# Patient Record
Sex: Male | Born: 1935 | Race: White | Hispanic: No | Marital: Married | Smoking: Light tobacco smoker
Health system: Southern US, Community
[De-identification: ages and names within clinical notes are randomized; demographics above are authoritative.]

## PROBLEM LIST (undated history)

## (undated) DIAGNOSIS — E039 Hypothyroidism, unspecified: Secondary | ICD-10-CM

## (undated) DIAGNOSIS — E785 Hyperlipidemia, unspecified: Secondary | ICD-10-CM

## (undated) DIAGNOSIS — I447 Left bundle-branch block, unspecified: Secondary | ICD-10-CM

## (undated) DIAGNOSIS — E119 Type 2 diabetes mellitus without complications: Secondary | ICD-10-CM

## (undated) DIAGNOSIS — R0789 Other chest pain: Secondary | ICD-10-CM

## (undated) HISTORY — PX: CHOLECYSTECTOMY: SHX55

## (undated) HISTORY — DX: Type 2 diabetes mellitus without complications: E11.9

## (undated) HISTORY — PX: HERNIA REPAIR: SHX51

## (undated) HISTORY — DX: Left bundle-branch block, unspecified: I44.7

## (undated) HISTORY — DX: Hyperlipidemia, unspecified: E78.5

---

## 2000-09-21 HISTORY — PX: CARDIAC CATHETERIZATION: SHX172

## 2000-10-05 ENCOUNTER — Inpatient Hospital Stay (HOSPITAL_COMMUNITY): Admission: EM | Admit: 2000-10-05 | Discharge: 2000-10-06 | Payer: Self-pay | Admitting: *Deleted

## 2000-10-05 ENCOUNTER — Encounter: Payer: Self-pay | Admitting: *Deleted

## 2004-08-21 ENCOUNTER — Ambulatory Visit (HOSPITAL_COMMUNITY): Admission: RE | Admit: 2004-08-21 | Discharge: 2004-08-21 | Payer: Self-pay | Admitting: Gastroenterology

## 2011-01-14 ENCOUNTER — Other Ambulatory Visit: Payer: Self-pay | Admitting: Dermatology

## 2011-07-15 ENCOUNTER — Other Ambulatory Visit: Payer: Self-pay | Admitting: Dermatology

## 2012-01-06 DIAGNOSIS — E119 Type 2 diabetes mellitus without complications: Secondary | ICD-10-CM | POA: Diagnosis not present

## 2012-01-06 DIAGNOSIS — E782 Mixed hyperlipidemia: Secondary | ICD-10-CM | POA: Diagnosis not present

## 2012-01-15 ENCOUNTER — Other Ambulatory Visit: Payer: Self-pay | Admitting: Dermatology

## 2012-01-15 DIAGNOSIS — D485 Neoplasm of uncertain behavior of skin: Secondary | ICD-10-CM | POA: Diagnosis not present

## 2012-01-15 DIAGNOSIS — L821 Other seborrheic keratosis: Secondary | ICD-10-CM | POA: Diagnosis not present

## 2012-01-15 DIAGNOSIS — Z85828 Personal history of other malignant neoplasm of skin: Secondary | ICD-10-CM | POA: Diagnosis not present

## 2012-01-15 DIAGNOSIS — C4441 Basal cell carcinoma of skin of scalp and neck: Secondary | ICD-10-CM | POA: Diagnosis not present

## 2012-01-15 DIAGNOSIS — C4442 Squamous cell carcinoma of skin of scalp and neck: Secondary | ICD-10-CM | POA: Diagnosis not present

## 2012-01-15 DIAGNOSIS — C44319 Basal cell carcinoma of skin of other parts of face: Secondary | ICD-10-CM | POA: Diagnosis not present

## 2012-02-03 DIAGNOSIS — C4492 Squamous cell carcinoma of skin, unspecified: Secondary | ICD-10-CM | POA: Diagnosis not present

## 2012-03-10 DIAGNOSIS — E039 Hypothyroidism, unspecified: Secondary | ICD-10-CM | POA: Diagnosis not present

## 2012-03-10 DIAGNOSIS — E1149 Type 2 diabetes mellitus with other diabetic neurological complication: Secondary | ICD-10-CM | POA: Diagnosis not present

## 2012-03-10 DIAGNOSIS — E7800 Pure hypercholesterolemia, unspecified: Secondary | ICD-10-CM | POA: Diagnosis not present

## 2012-03-10 DIAGNOSIS — E559 Vitamin D deficiency, unspecified: Secondary | ICD-10-CM | POA: Diagnosis not present

## 2012-03-10 DIAGNOSIS — Z79899 Other long term (current) drug therapy: Secondary | ICD-10-CM | POA: Diagnosis not present

## 2012-03-16 DIAGNOSIS — K219 Gastro-esophageal reflux disease without esophagitis: Secondary | ICD-10-CM | POA: Diagnosis not present

## 2012-03-16 DIAGNOSIS — E039 Hypothyroidism, unspecified: Secondary | ICD-10-CM | POA: Diagnosis not present

## 2012-03-16 DIAGNOSIS — E7800 Pure hypercholesterolemia, unspecified: Secondary | ICD-10-CM | POA: Diagnosis not present

## 2012-03-16 DIAGNOSIS — E1149 Type 2 diabetes mellitus with other diabetic neurological complication: Secondary | ICD-10-CM | POA: Diagnosis not present

## 2012-06-02 DIAGNOSIS — E119 Type 2 diabetes mellitus without complications: Secondary | ICD-10-CM | POA: Diagnosis not present

## 2012-07-14 ENCOUNTER — Other Ambulatory Visit: Payer: Self-pay | Admitting: Dermatology

## 2012-07-14 DIAGNOSIS — Z85828 Personal history of other malignant neoplasm of skin: Secondary | ICD-10-CM | POA: Diagnosis not present

## 2012-07-14 DIAGNOSIS — C44519 Basal cell carcinoma of skin of other part of trunk: Secondary | ICD-10-CM | POA: Diagnosis not present

## 2012-07-14 DIAGNOSIS — L570 Actinic keratosis: Secondary | ICD-10-CM | POA: Diagnosis not present

## 2012-07-14 DIAGNOSIS — C44611 Basal cell carcinoma of skin of unspecified upper limb, including shoulder: Secondary | ICD-10-CM | POA: Diagnosis not present

## 2012-07-14 DIAGNOSIS — C44211 Basal cell carcinoma of skin of unspecified ear and external auricular canal: Secondary | ICD-10-CM | POA: Diagnosis not present

## 2012-07-14 DIAGNOSIS — D239 Other benign neoplasm of skin, unspecified: Secondary | ICD-10-CM | POA: Diagnosis not present

## 2012-07-15 DIAGNOSIS — E1149 Type 2 diabetes mellitus with other diabetic neurological complication: Secondary | ICD-10-CM | POA: Diagnosis not present

## 2012-07-15 DIAGNOSIS — E039 Hypothyroidism, unspecified: Secondary | ICD-10-CM | POA: Diagnosis not present

## 2012-07-15 DIAGNOSIS — E7800 Pure hypercholesterolemia, unspecified: Secondary | ICD-10-CM | POA: Diagnosis not present

## 2012-07-16 DIAGNOSIS — E7800 Pure hypercholesterolemia, unspecified: Secondary | ICD-10-CM | POA: Diagnosis not present

## 2012-07-16 DIAGNOSIS — K219 Gastro-esophageal reflux disease without esophagitis: Secondary | ICD-10-CM | POA: Diagnosis not present

## 2012-07-16 DIAGNOSIS — E039 Hypothyroidism, unspecified: Secondary | ICD-10-CM | POA: Diagnosis not present

## 2012-07-16 DIAGNOSIS — E1149 Type 2 diabetes mellitus with other diabetic neurological complication: Secondary | ICD-10-CM | POA: Diagnosis not present

## 2012-09-21 DIAGNOSIS — J019 Acute sinusitis, unspecified: Secondary | ICD-10-CM | POA: Diagnosis not present

## 2012-10-29 DIAGNOSIS — E1149 Type 2 diabetes mellitus with other diabetic neurological complication: Secondary | ICD-10-CM | POA: Diagnosis not present

## 2012-10-29 DIAGNOSIS — E039 Hypothyroidism, unspecified: Secondary | ICD-10-CM | POA: Diagnosis not present

## 2012-10-29 DIAGNOSIS — Z125 Encounter for screening for malignant neoplasm of prostate: Secondary | ICD-10-CM | POA: Diagnosis not present

## 2012-10-29 DIAGNOSIS — E7800 Pure hypercholesterolemia, unspecified: Secondary | ICD-10-CM | POA: Diagnosis not present

## 2012-11-05 DIAGNOSIS — Z23 Encounter for immunization: Secondary | ICD-10-CM | POA: Diagnosis not present

## 2012-11-05 DIAGNOSIS — E1149 Type 2 diabetes mellitus with other diabetic neurological complication: Secondary | ICD-10-CM | POA: Diagnosis not present

## 2012-11-05 DIAGNOSIS — K219 Gastro-esophageal reflux disease without esophagitis: Secondary | ICD-10-CM | POA: Diagnosis not present

## 2012-11-05 DIAGNOSIS — E7800 Pure hypercholesterolemia, unspecified: Secondary | ICD-10-CM | POA: Diagnosis not present

## 2012-11-05 DIAGNOSIS — E039 Hypothyroidism, unspecified: Secondary | ICD-10-CM | POA: Diagnosis not present

## 2013-01-13 ENCOUNTER — Other Ambulatory Visit: Payer: Self-pay | Admitting: Dermatology

## 2013-01-13 DIAGNOSIS — D485 Neoplasm of uncertain behavior of skin: Secondary | ICD-10-CM | POA: Diagnosis not present

## 2013-01-13 DIAGNOSIS — C44211 Basal cell carcinoma of skin of unspecified ear and external auricular canal: Secondary | ICD-10-CM | POA: Diagnosis not present

## 2013-01-13 DIAGNOSIS — L570 Actinic keratosis: Secondary | ICD-10-CM | POA: Diagnosis not present

## 2013-01-13 DIAGNOSIS — Z85828 Personal history of other malignant neoplasm of skin: Secondary | ICD-10-CM | POA: Diagnosis not present

## 2013-01-13 DIAGNOSIS — L821 Other seborrheic keratosis: Secondary | ICD-10-CM | POA: Diagnosis not present

## 2013-01-17 ENCOUNTER — Other Ambulatory Visit (HOSPITAL_COMMUNITY): Payer: Self-pay | Admitting: Cardiovascular Disease

## 2013-01-17 DIAGNOSIS — I10 Essential (primary) hypertension: Secondary | ICD-10-CM | POA: Diagnosis not present

## 2013-01-17 DIAGNOSIS — R0989 Other specified symptoms and signs involving the circulatory and respiratory systems: Secondary | ICD-10-CM

## 2013-01-17 DIAGNOSIS — E119 Type 2 diabetes mellitus without complications: Secondary | ICD-10-CM | POA: Diagnosis not present

## 2013-01-17 DIAGNOSIS — E782 Mixed hyperlipidemia: Secondary | ICD-10-CM | POA: Diagnosis not present

## 2013-01-19 ENCOUNTER — Encounter (HOSPITAL_COMMUNITY): Payer: Self-pay

## 2013-01-26 ENCOUNTER — Ambulatory Visit (HOSPITAL_COMMUNITY)
Admission: RE | Admit: 2013-01-26 | Discharge: 2013-01-26 | Disposition: A | Discharge status: Home / Self Care | Payer: Medicare Other | Source: Ambulatory Visit | Attending: Cardiovascular Disease | Admitting: Cardiovascular Disease

## 2013-01-26 DIAGNOSIS — I2510 Atherosclerotic heart disease of native coronary artery without angina pectoris: Secondary | ICD-10-CM | POA: Insufficient documentation

## 2013-01-26 DIAGNOSIS — E109 Type 1 diabetes mellitus without complications: Secondary | ICD-10-CM | POA: Diagnosis not present

## 2013-01-26 DIAGNOSIS — E119 Type 2 diabetes mellitus without complications: Secondary | ICD-10-CM | POA: Insufficient documentation

## 2013-01-26 DIAGNOSIS — R0989 Other specified symptoms and signs involving the circulatory and respiratory systems: Secondary | ICD-10-CM | POA: Insufficient documentation

## 2013-01-26 DIAGNOSIS — I10 Essential (primary) hypertension: Secondary | ICD-10-CM | POA: Diagnosis not present

## 2013-01-26 HISTORY — PX: OTHER SURGICAL HISTORY: SHX169

## 2013-01-26 MED ORDER — REGADENOSON 0.4 MG/5ML IV SOLN
0.4000 mg | Freq: Once | INTRAVENOUS | Status: AC
  Administered 2013-01-26: 0.4 mg via INTRAVENOUS

## 2013-01-26 MED ORDER — TECHNETIUM TC 99M SESTAMIBI GENERIC - CARDIOLITE
30.0000 | Freq: Once | INTRAVENOUS | Status: AC | PRN
  Administered 2013-01-26: 30 via INTRAVENOUS

## 2013-01-26 MED ORDER — TECHNETIUM TC 99M SESTAMIBI GENERIC - CARDIOLITE
10.0000 | Freq: Once | INTRAVENOUS | Status: AC | PRN
  Administered 2013-01-26: 10 via INTRAVENOUS

## 2013-01-26 NOTE — Procedures (Addendum)
[facility] [address] [place] [address] [place] [address] [phone]  Cardiology Nuclear Med Tristan Maxwell is a 79 y.o. male     MRN : [id number]     DOB: 11-09-1934  Procedure Date: 01/26/2013  Nuclear Med Background Indication for Stress Test:  Evaluation for Ischemia History:  non critical CAD with normal LV function Cardiac Risk Factors: History of Smoking, Hypertension, Lipids and NIDDM  Symptoms:  none   Nuclear Pre-Procedure Caffeine/Decaff Intake:  1:00am NPO After: 11:00am   IV Site: R Antecubital  IV 0.9% NS with Angio Cath:  22g  Chest Size (in):  42 IV Started by: Koren Mahlum, CNMT  Height: 5\' 11"  (1.803 m)  Cup Size: n/a  BMI:  Body mass index is 23.29 kg/(m^2). Weight:  167 lb (75.751 kg)   Tech Comments:  n/a    Nuclear Med Study 1 or 2 day study: 1 day  Stress Test Type:  Lexiscan  Order Authorizing Provider:  Nanetta Batty, MD   Resting Radionuclide: Technetium 69m Sestamibi  Resting Radionuclide Dose: 10.2 mCi   Stress Radionuclide:  Technetium 63m Sestamibi  Stress Radionuclide Dose: 30.6 mCi           Stress Protocol Rest HR: 64 Stress HR: 83  Rest BP: 132/77 Stress BP: 144/67  Exercise Time (min): n/a METS: n/a   Predicted Max HR: 144 bpm % Max HR: 57.64 bpm Rate Pressure Product: 21308   Dose of Adenosine (mg):  n/a Dose of Lexiscan: 0.4 mg  Dose of Atropine (mg): n/a Dose of Dobutamine: n/a mcg/kg/min (at max HR)  Stress Test Technologist: Esperanza Sheets, CCT Nuclear Technologist: Koren Siddoway, CNMT   Rest Procedure:  Myocardial perfusion imaging was performed at rest 45 minutes following the intravenous administration of Technetium 99m Sestamibi. Stress Procedure:  The patient received IV Lexiscan 0.4 mg over 15-seconds.  Technetium 11m Sestamibi injected at 30-seconds.  There were no significant changes with Lexiscan.  Quantitative spect images were obtained after a 45 minute  delay.  Transient Ischemic Dilatation (Normal <1.22):  1.17 Lung/Heart Ratio (Normal <0.45):  0.25 QGS EDV:  92 ml QGS ESV:  34 ml LV Ejection Fraction: 63%  Signed by Koren Haney, CNMT  PHYSICIAN INTERPRETATION:  Rest ECG: NSR with non-specific ST-T wave changes  Stress ECG: No significant change from baseline ECG  QPS Raw Data Images:  Mild diaphragmatic attenuation.  Normal left ventricular size. Stress Images:  There is a medium sized, moderate intensity perfusion defect noted in the basal to mid inferior-inferoseptal wall.  There is no evidence of reversibility.  Gated images do not reveal a wall motion abnormality in this region, suggesting the presence of artifact and not infarct. Rest Images:  Comparison with the stress images reveals no significant change. Subtraction (SDS):  No reversibility is appreciated. There is a fixed inferior defect that is most consistent with diaphragmatic attenuation. There is no evidence of scar or ischemia.  Impression Exercise Capacity:  Lexiscan with no exercise. BP Response:  Normal blood pressure response. Clinical Symptoms:  There is dyspnea. ECG Impression:  No significant ST segment change with adenosine. LV Wall Motion:  NL LV Function; NL Wall Motion  Comparison with Prior Nuclear Study: No significant change from previous study  Overall Impression:  Abnormal perfusion imaging with presence of an inferior-inferoseptal perfusion defect consistent with diaphragmatic attenuation.  Low risk stress nuclear study.   Marykay Lex, MD  01/27/2013 2:59 PM

## 2013-01-26 NOTE — Progress Notes (Signed)
Carotid duplex Doppler completed. Tristan Tristan

## 2013-02-07 DIAGNOSIS — J329 Chronic sinusitis, unspecified: Secondary | ICD-10-CM | POA: Diagnosis not present

## 2013-02-07 DIAGNOSIS — J309 Allergic rhinitis, unspecified: Secondary | ICD-10-CM | POA: Diagnosis not present

## 2013-02-24 DIAGNOSIS — E559 Vitamin D deficiency, unspecified: Secondary | ICD-10-CM | POA: Diagnosis not present

## 2013-02-24 DIAGNOSIS — E7800 Pure hypercholesterolemia, unspecified: Secondary | ICD-10-CM | POA: Diagnosis not present

## 2013-02-24 DIAGNOSIS — E1149 Type 2 diabetes mellitus with other diabetic neurological complication: Secondary | ICD-10-CM | POA: Diagnosis not present

## 2013-02-24 DIAGNOSIS — E039 Hypothyroidism, unspecified: Secondary | ICD-10-CM | POA: Diagnosis not present

## 2013-03-04 DIAGNOSIS — E1149 Type 2 diabetes mellitus with other diabetic neurological complication: Secondary | ICD-10-CM | POA: Diagnosis not present

## 2013-03-04 DIAGNOSIS — IMO0002 Reserved for concepts with insufficient information to code with codable children: Secondary | ICD-10-CM | POA: Diagnosis not present

## 2013-03-04 DIAGNOSIS — E039 Hypothyroidism, unspecified: Secondary | ICD-10-CM | POA: Diagnosis not present

## 2013-03-04 DIAGNOSIS — E7800 Pure hypercholesterolemia, unspecified: Secondary | ICD-10-CM | POA: Diagnosis not present

## 2013-04-23 ENCOUNTER — Encounter: Payer: Self-pay | Admitting: Cardiovascular Disease

## 2013-05-23 DIAGNOSIS — R634 Abnormal weight loss: Secondary | ICD-10-CM | POA: Diagnosis not present

## 2013-05-23 DIAGNOSIS — E1149 Type 2 diabetes mellitus with other diabetic neurological complication: Secondary | ICD-10-CM | POA: Diagnosis not present

## 2013-05-27 DIAGNOSIS — R634 Abnormal weight loss: Secondary | ICD-10-CM | POA: Diagnosis not present

## 2013-05-27 DIAGNOSIS — K5730 Diverticulosis of large intestine without perforation or abscess without bleeding: Secondary | ICD-10-CM | POA: Diagnosis not present

## 2013-05-27 DIAGNOSIS — K449 Diaphragmatic hernia without obstruction or gangrene: Secondary | ICD-10-CM | POA: Diagnosis not present

## 2013-06-30 DIAGNOSIS — E119 Type 2 diabetes mellitus without complications: Secondary | ICD-10-CM | POA: Diagnosis not present

## 2013-07-14 DIAGNOSIS — L570 Actinic keratosis: Secondary | ICD-10-CM | POA: Diagnosis not present

## 2013-07-14 DIAGNOSIS — L820 Inflamed seborrheic keratosis: Secondary | ICD-10-CM | POA: Diagnosis not present

## 2013-07-14 DIAGNOSIS — L919 Hypertrophic disorder of the skin, unspecified: Secondary | ICD-10-CM | POA: Diagnosis not present

## 2013-07-14 DIAGNOSIS — Z85828 Personal history of other malignant neoplasm of skin: Secondary | ICD-10-CM | POA: Diagnosis not present

## 2013-07-14 DIAGNOSIS — L821 Other seborrheic keratosis: Secondary | ICD-10-CM | POA: Diagnosis not present

## 2013-07-14 DIAGNOSIS — L723 Sebaceous cyst: Secondary | ICD-10-CM | POA: Diagnosis not present

## 2013-09-02 DIAGNOSIS — E559 Vitamin D deficiency, unspecified: Secondary | ICD-10-CM | POA: Diagnosis not present

## 2013-09-02 DIAGNOSIS — E7800 Pure hypercholesterolemia, unspecified: Secondary | ICD-10-CM | POA: Diagnosis not present

## 2013-09-02 DIAGNOSIS — E039 Hypothyroidism, unspecified: Secondary | ICD-10-CM | POA: Diagnosis not present

## 2013-09-02 DIAGNOSIS — E1149 Type 2 diabetes mellitus with other diabetic neurological complication: Secondary | ICD-10-CM | POA: Diagnosis not present

## 2013-09-07 DIAGNOSIS — E039 Hypothyroidism, unspecified: Secondary | ICD-10-CM | POA: Diagnosis not present

## 2013-09-07 DIAGNOSIS — E1149 Type 2 diabetes mellitus with other diabetic neurological complication: Secondary | ICD-10-CM | POA: Diagnosis not present

## 2013-09-07 DIAGNOSIS — E559 Vitamin D deficiency, unspecified: Secondary | ICD-10-CM | POA: Diagnosis not present

## 2013-09-07 DIAGNOSIS — E7800 Pure hypercholesterolemia, unspecified: Secondary | ICD-10-CM | POA: Diagnosis not present

## 2013-09-14 DIAGNOSIS — Z23 Encounter for immunization: Secondary | ICD-10-CM | POA: Diagnosis not present

## 2013-12-29 DIAGNOSIS — H5200 Hypermetropia, unspecified eye: Secondary | ICD-10-CM | POA: Diagnosis not present

## 2013-12-29 DIAGNOSIS — H52229 Regular astigmatism, unspecified eye: Secondary | ICD-10-CM | POA: Diagnosis not present

## 2013-12-29 DIAGNOSIS — H2510 Age-related nuclear cataract, unspecified eye: Secondary | ICD-10-CM | POA: Diagnosis not present

## 2013-12-29 DIAGNOSIS — H2589 Other age-related cataract: Secondary | ICD-10-CM | POA: Diagnosis not present

## 2014-01-10 DIAGNOSIS — L821 Other seborrheic keratosis: Secondary | ICD-10-CM | POA: Diagnosis not present

## 2014-01-10 DIAGNOSIS — Z85828 Personal history of other malignant neoplasm of skin: Secondary | ICD-10-CM | POA: Diagnosis not present

## 2014-01-10 DIAGNOSIS — L570 Actinic keratosis: Secondary | ICD-10-CM | POA: Diagnosis not present

## 2014-01-17 ENCOUNTER — Ambulatory Visit: Payer: Medicare Other | Admitting: Cardiovascular Disease

## 2014-02-28 ENCOUNTER — Ambulatory Visit: Payer: Medicare Other | Admitting: Cardiovascular Disease

## 2014-03-01 ENCOUNTER — Ambulatory Visit: Payer: Medicare Other | Admitting: Cardiovascular Disease

## 2014-03-02 ENCOUNTER — Ambulatory Visit: Payer: Medicare Other | Admitting: Cardiovascular Disease

## 2014-03-07 DIAGNOSIS — E7800 Pure hypercholesterolemia, unspecified: Secondary | ICD-10-CM | POA: Diagnosis not present

## 2014-03-07 DIAGNOSIS — E039 Hypothyroidism, unspecified: Secondary | ICD-10-CM | POA: Diagnosis not present

## 2014-03-07 DIAGNOSIS — E1149 Type 2 diabetes mellitus with other diabetic neurological complication: Secondary | ICD-10-CM | POA: Diagnosis not present

## 2014-03-07 DIAGNOSIS — Z125 Encounter for screening for malignant neoplasm of prostate: Secondary | ICD-10-CM | POA: Diagnosis not present

## 2014-03-08 ENCOUNTER — Ambulatory Visit (INDEPENDENT_AMBULATORY_CARE_PROVIDER_SITE_OTHER): Payer: Medicare Other | Admitting: Cardiovascular Disease

## 2014-03-08 ENCOUNTER — Encounter: Payer: Self-pay | Admitting: Cardiovascular Disease

## 2014-03-08 VITALS — BP 130/86 | HR 62 | Ht 71.0 in | Wt 161.1 lb

## 2014-03-08 DIAGNOSIS — E785 Hyperlipidemia, unspecified: Secondary | ICD-10-CM

## 2014-03-08 DIAGNOSIS — E119 Type 2 diabetes mellitus without complications: Secondary | ICD-10-CM | POA: Insufficient documentation

## 2014-03-08 MED ORDER — ROSUVASTATIN CALCIUM 5 MG PO TABS: 5.0000 mg | ORAL_TABLET | ORAL | Status: DC

## 2014-03-08 NOTE — Patient Instructions (Signed)
Your physician wants you to follow-up in: 1 year with Dr Berry. You will receive a reminder letter in the mail two months in advance. If you don't receive a letter, please call our office to schedule the follow-up appointment.  

## 2014-03-08 NOTE — Progress Notes (Signed)
03/08/2014 Mindi Slicker Marner   10-25-1934  [id number]  [name], MD Primary Cardiologist: Lorretta Harp MD Renae Gloss   HPI:  The patient is a very pleasant 80 year old thin and fit-appearing widowed Caucasian male, father of 23 and grandfather to 1 grandchild, whom I last saw a year ago. His wife of 69 years died in 03/07/2008 after sustaining an injury from a fall. He has 1 daughter, who lives in [place], whom he sees once a year. He saw her on the [employer] this year. I catheterized him back in October 2001 revealing noncritical CAD with normal LV function. His prior catheterization before that was in 1994. He denies chest pain or shortness of breath. Lipid profile performed by his PCP 02/24/13 revealed a total cholesterol 161, LDL of 70 and HDL of 76. Since I saw him a year ago he has been completely asymptomatic and specifically denying chest pain or shortness of breath     Current Outpatient Prescriptions  Medication Sig Dispense Refill  . aspirin 325 MG tablet Take 325 mg by mouth daily.      . B Complex Vitamins (VITAMIN B COMPLEX PO) Take 1 tablet by mouth daily.      . fluticasone (FLONASE) 50 MCG/ACT nasal spray Place 1 spray into both nostrils daily as needed.      Marland Kitchen glimepiride (AMARYL) 1 MG tablet Take 1 mg by mouth daily.      Marland Kitchen levothyroxine (SYNTHROID, LEVOTHROID) 125 MCG tablet Take 125 mcg by mouth daily.      . metFORMIN (GLUCOPHAGE-XR) 500 MG 24 hr tablet Take 500 mg by mouth 2 (two) times daily.      Marland Kitchen omeprazole (PRILOSEC) 20 MG capsule Take 20 mg by mouth daily as needed.      . Saw Palmetto, Serenoa repens, (SAW PALMETTO PO) Take 1 capsule by mouth daily.       No current facility-administered medications for this visit.    No Known Allergies  History   Social History  . Marital Status: Married    Spouse Name: N/A    Number of Children: N/A  . Years of Education: N/A   Occupational History  . Not on file.    Social History Main Topics  . Smoking status: Former Research scientist (life sciences)  . Smokeless tobacco: Current User  . Alcohol Use: Yes     Comment: Occas  . Drug Use: No  . Sexual Activity: Not on file   Other Topics Concern  . Not on file   Social History Narrative  . No narrative on file     Review of Systems: General: negative for chills, fever, night sweats or weight changes.  Cardiovascular: negative for chest pain, dyspnea on exertion, edema, orthopnea, palpitations, paroxysmal nocturnal dyspnea or shortness of breath Dermatological: negative for rash Respiratory: negative for cough or wheezing Urologic: negative for hematuria Abdominal: negative for nausea, vomiting, diarrhea, bright red blood per rectum, melena, or hematemesis Neurologic: negative for visual changes, syncope, or dizziness All other systems reviewed and are otherwise negative except as noted above.    Blood pressure 130/86, pulse 62, height 5\' 11"  (1.803 m), weight 161 lb 1.6 oz (73.074 kg).  General appearance: alert and no distress Neck: no adenopathy, no carotid bruit, no JVD, supple, symmetrical, trachea midline and thyroid not enlarged, symmetric, no tenderness/mass/nodules Lungs: clear to auscultation bilaterally Heart: regular rate and rhythm, S1, S2 normal, no murmur, click, rub or gallop Extremities: extremities normal, atraumatic,  no cyanosis or edema  EKG normal sinus rhythm at 62 with no ST or T wave changes  ASSESSMENT AND PLAN:   Hyperlipidemia On statin therapy followed by his PCP      Lorretta Harp MD [facility], [facility] 03/08/2014 1:03 PM

## 2014-03-08 NOTE — Assessment & Plan Note (Signed)
On statin therapy followed by his PCP 

## 2014-03-09 DIAGNOSIS — Z1331 Encounter for screening for depression: Secondary | ICD-10-CM | POA: Diagnosis not present

## 2014-03-09 DIAGNOSIS — IMO0002 Reserved for concepts with insufficient information to code with codable children: Secondary | ICD-10-CM | POA: Diagnosis not present

## 2014-03-09 DIAGNOSIS — E1149 Type 2 diabetes mellitus with other diabetic neurological complication: Secondary | ICD-10-CM | POA: Diagnosis not present

## 2014-03-09 DIAGNOSIS — Z9181 History of falling: Secondary | ICD-10-CM | POA: Diagnosis not present

## 2014-03-09 DIAGNOSIS — E039 Hypothyroidism, unspecified: Secondary | ICD-10-CM | POA: Diagnosis not present

## 2014-03-09 DIAGNOSIS — E7800 Pure hypercholesterolemia, unspecified: Secondary | ICD-10-CM | POA: Diagnosis not present

## 2014-07-20 DIAGNOSIS — H2589 Other age-related cataract: Secondary | ICD-10-CM | POA: Diagnosis not present

## 2014-07-20 DIAGNOSIS — E119 Type 2 diabetes mellitus without complications: Secondary | ICD-10-CM | POA: Diagnosis not present

## 2014-07-20 DIAGNOSIS — H52229 Regular astigmatism, unspecified eye: Secondary | ICD-10-CM | POA: Diagnosis not present

## 2014-07-20 DIAGNOSIS — H5200 Hypermetropia, unspecified eye: Secondary | ICD-10-CM | POA: Diagnosis not present

## 2014-09-11 DIAGNOSIS — E7800 Pure hypercholesterolemia, unspecified: Secondary | ICD-10-CM | POA: Diagnosis not present

## 2014-09-11 DIAGNOSIS — Z79899 Other long term (current) drug therapy: Secondary | ICD-10-CM | POA: Diagnosis not present

## 2014-09-11 DIAGNOSIS — E039 Hypothyroidism, unspecified: Secondary | ICD-10-CM | POA: Diagnosis not present

## 2014-09-11 DIAGNOSIS — E1149 Type 2 diabetes mellitus with other diabetic neurological complication: Secondary | ICD-10-CM | POA: Diagnosis not present

## 2014-09-11 DIAGNOSIS — E559 Vitamin D deficiency, unspecified: Secondary | ICD-10-CM | POA: Diagnosis not present

## 2014-09-13 DIAGNOSIS — E1149 Type 2 diabetes mellitus with other diabetic neurological complication: Secondary | ICD-10-CM | POA: Diagnosis not present

## 2014-09-13 DIAGNOSIS — Z23 Encounter for immunization: Secondary | ICD-10-CM | POA: Diagnosis not present

## 2014-09-13 DIAGNOSIS — E039 Hypothyroidism, unspecified: Secondary | ICD-10-CM | POA: Diagnosis not present

## 2014-09-13 DIAGNOSIS — E7800 Pure hypercholesterolemia, unspecified: Secondary | ICD-10-CM | POA: Diagnosis not present

## 2014-09-13 DIAGNOSIS — E559 Vitamin D deficiency, unspecified: Secondary | ICD-10-CM | POA: Diagnosis not present

## 2014-12-21 DIAGNOSIS — R194 Change in bowel habit: Secondary | ICD-10-CM | POA: Diagnosis not present

## 2014-12-21 DIAGNOSIS — K5900 Constipation, unspecified: Secondary | ICD-10-CM | POA: Diagnosis not present

## 2014-12-21 DIAGNOSIS — Z9889 Other specified postprocedural states: Secondary | ICD-10-CM | POA: Diagnosis not present

## 2015-01-09 DIAGNOSIS — Z85828 Personal history of other malignant neoplasm of skin: Secondary | ICD-10-CM | POA: Diagnosis not present

## 2015-01-09 DIAGNOSIS — L821 Other seborrheic keratosis: Secondary | ICD-10-CM | POA: Diagnosis not present

## 2015-01-09 DIAGNOSIS — L570 Actinic keratosis: Secondary | ICD-10-CM | POA: Diagnosis not present

## 2015-02-23 ENCOUNTER — Encounter: Payer: Self-pay | Admitting: *Deleted

## 2015-03-07 ENCOUNTER — Other Ambulatory Visit: Payer: Self-pay | Admitting: Gastroenterology

## 2015-03-09 ENCOUNTER — Encounter: Payer: Self-pay | Admitting: Cardiovascular Disease

## 2015-03-09 ENCOUNTER — Ambulatory Visit (INDEPENDENT_AMBULATORY_CARE_PROVIDER_SITE_OTHER): Payer: Medicare Other | Admitting: Cardiovascular Disease

## 2015-03-09 VITALS — BP 122/70 | HR 64 | Ht 71.0 in | Wt 156.2 lb

## 2015-03-09 DIAGNOSIS — I447 Left bundle-branch block, unspecified: Secondary | ICD-10-CM | POA: Diagnosis not present

## 2015-03-09 DIAGNOSIS — E785 Hyperlipidemia, unspecified: Secondary | ICD-10-CM | POA: Diagnosis not present

## 2015-03-09 NOTE — Assessment & Plan Note (Signed)
EKG today reveals a new left bundle-branch block. He is asymptomatic. No diagnostic studies are required at this time

## 2015-03-09 NOTE — Patient Instructions (Signed)
Dr Berry recommends that you schedule a follow-up appointment in 1 year. You will receive a reminder letter in the mail two months in advance. If you don't receive a letter, please call our office to schedule the follow-up appointment. 

## 2015-03-09 NOTE — Assessment & Plan Note (Signed)
History of hyperlipidemia on rosuvastatin 5 mg a day followed by his PCP

## 2015-03-09 NOTE — Progress Notes (Signed)
03/09/2015 Tristan Maxwell   [birth year]  [id number]  [name], MD Primary Cardiologist: Lorretta Harp MD Renae Gloss   HPI:  The patient is a very pleasant 81 year old thin and fit-appearing widowed Caucasian male, father of 46 and grandfather to 1 grandchild, whom I last saw a year ago. His wife of 62 years died in 11-Apr-2008 after sustaining an injury from a fall. He has 1 daughter, who lives in [place], whom he sees once a year. He saw her on the [employer] this year. I catheterized him back in October 2001 revealing noncritical CAD with normal LV function. His prior catheterization before that was in April 11, 1993. He denies chest pain or shortness of breath. Lipid profile performed by his PCP 02/24/13 revealed a total cholesterol 161, LDL of 70 and HDL of 76. Since I saw him a year ago he has been completely asymptomatic and specifically denying chest pain or shortness of breath   Current Outpatient Prescriptions  Medication Sig Dispense Refill  . aspirin 325 MG tablet Take 325 mg by mouth daily.    . B Complex Vitamins (VITAMIN B COMPLEX PO) Take 1 tablet by mouth daily.    . cholecalciferol (VITAMIN D) 1000 UNITS tablet Take 1,000 Units by mouth daily.    . fluticasone (FLONASE) 50 MCG/ACT nasal spray Place 1 spray into both nostrils daily as needed.    Marland Kitchen glimepiride (AMARYL) 1 MG tablet Take 1 mg by mouth daily.    Marland Kitchen levothyroxine (SYNTHROID, LEVOTHROID) 125 MCG tablet Take 125 mcg by mouth daily.    . metFORMIN (GLUCOPHAGE-XR) 500 MG 24 hr tablet Take 500 mg by mouth 2 (two) times daily.    . Methylcellulose, Laxative, 500 MG TABS Take 1 tablet by mouth daily.    . Omega-3 Fatty Acids (OMEGA 3 PO) Take 2,000 mg by mouth daily.    . Probiotic Product (PROBIOTIC DAILY PO) Take 1 capsule by mouth daily.    . rosuvastatin (CRESTOR) 5 MG tablet Take 1 tablet (5 mg total) by mouth every other day. 25 tablet 0  . Saw Palmetto, Serenoa repens, (SAW PALMETTO  PO) Take 1 capsule by mouth daily.     No current facility-administered medications for this visit.    No Known Allergies  History   Social History  . Marital Status: Married    Spouse Name: N/A  . Number of Children: N/A  . Years of Education: N/A   Occupational History  . Not on file.   Social History Main Topics  . Smoking status: Former Research scientist (life sciences)  . Smokeless tobacco: Current User  . Alcohol Use: Yes     Comment: Occas  . Drug Use: No  . Sexual Activity: Not on file   Other Topics Concern  . Not on file   Social History Narrative     Review of Systems: General: negative for chills, fever, night sweats or weight changes.  Cardiovascular: negative for chest pain, dyspnea on exertion, edema, orthopnea, palpitations, paroxysmal nocturnal dyspnea or shortness of breath Dermatological: negative for rash Respiratory: negative for cough or wheezing Urologic: negative for hematuria Abdominal: negative for nausea, vomiting, diarrhea, bright red blood per rectum, melena, or hematemesis Neurologic: negative for visual changes, syncope, or dizziness All other systems reviewed and are otherwise negative except as noted above.    Blood pressure 122/70, pulse 64, height 5\' 11"  (1.803 m), weight 156 lb 3.2 oz (70.852 kg).  General appearance: alert and no distress Neck:  no adenopathy, no carotid bruit, no JVD, supple, symmetrical, trachea midline and thyroid not enlarged, symmetric, no tenderness/mass/nodules Lungs: clear to auscultation bilaterally Heart: regular rate and rhythm, S1, S2 normal, no murmur, click, rub or gallop Extremities: extremities normal, atraumatic, no cyanosis or edema  EKG normal sinus rhythm at 64 with left bundle branch block new since previous tracing. I personally reviewed this EKG  ASSESSMENT AND PLAN:   Hyperlipidemia History of hyperlipidemia on rosuvastatin 5 mg a day followed by his PCP   Left bundle branch block EKG today reveals a new  left bundle-branch block. He is asymptomatic. No diagnostic studies are required at this time       Lorretta Harp MD [facility], [facility] 03/09/2015 1:38 PM

## 2015-03-19 DIAGNOSIS — E039 Hypothyroidism, unspecified: Secondary | ICD-10-CM | POA: Diagnosis not present

## 2015-03-19 DIAGNOSIS — E1149 Type 2 diabetes mellitus with other diabetic neurological complication: Secondary | ICD-10-CM | POA: Diagnosis not present

## 2015-03-19 DIAGNOSIS — E559 Vitamin D deficiency, unspecified: Secondary | ICD-10-CM | POA: Diagnosis not present

## 2015-03-19 DIAGNOSIS — E780 Pure hypercholesterolemia: Secondary | ICD-10-CM | POA: Diagnosis not present

## 2015-03-19 DIAGNOSIS — Z125 Encounter for screening for malignant neoplasm of prostate: Secondary | ICD-10-CM | POA: Diagnosis not present

## 2015-03-19 DIAGNOSIS — Z79899 Other long term (current) drug therapy: Secondary | ICD-10-CM | POA: Diagnosis not present

## 2015-03-26 DIAGNOSIS — E1149 Type 2 diabetes mellitus with other diabetic neurological complication: Secondary | ICD-10-CM | POA: Diagnosis not present

## 2015-03-26 DIAGNOSIS — E559 Vitamin D deficiency, unspecified: Secondary | ICD-10-CM | POA: Diagnosis not present

## 2015-03-26 DIAGNOSIS — E780 Pure hypercholesterolemia: Secondary | ICD-10-CM | POA: Diagnosis not present

## 2015-03-26 DIAGNOSIS — E039 Hypothyroidism, unspecified: Secondary | ICD-10-CM | POA: Diagnosis not present

## 2015-03-26 DIAGNOSIS — Z1389 Encounter for screening for other disorder: Secondary | ICD-10-CM | POA: Diagnosis not present

## 2015-03-26 DIAGNOSIS — Z9181 History of falling: Secondary | ICD-10-CM | POA: Diagnosis not present

## 2015-04-30 ENCOUNTER — Encounter (HOSPITAL_COMMUNITY): Payer: Self-pay | Admitting: *Deleted

## 2015-05-04 ENCOUNTER — Other Ambulatory Visit: Payer: Self-pay | Admitting: Gastroenterology

## 2015-05-07 NOTE — Anesthesia Preprocedure Evaluation (Addendum)
Anesthesia Evaluation  Patient identified by MRN, date of birth, ID band Patient awake    Reviewed: Allergy & Precautions, NPO status , Patient's Chart, lab work & pertinent test results  History of Anesthesia Complications Negative for: history of anesthetic complications  Airway Mallampati: II  TM Distance: >3 FB Neck ROM: Full    Dental no notable dental hx. (+) Dental Advisory Given   Pulmonary Current Smoker,  breath sounds clear to auscultation  Pulmonary exam normal       Cardiovascular negative cardio ROS Normal cardiovascular exam+ dysrhythmias (LBBB) Rhythm:Regular Rate:Normal     Neuro/Psych negative neurological ROS  negative psych ROS   GI/Hepatic negative GI ROS, Neg liver ROS,   Endo/Other  diabetesHypothyroidism   Renal/GU negative Renal ROS  negative genitourinary   Musculoskeletal negative musculoskeletal ROS (+)   Abdominal   Peds negative pediatric ROS (+)  Hematology negative hematology ROS (+)   Anesthesia Other Findings   Reproductive/Obstetrics negative OB ROS                           Anesthesia Physical Anesthesia Plan  ASA: II  Anesthesia Plan: MAC   Post-op Pain Management:    Induction:   Airway Management Planned:   Additional Equipment:   Intra-op Plan:   Post-operative Plan:   Informed Consent: I have reviewed the patients History and Physical, chart, labs and discussed the procedure including the risks, benefits and alternatives for the proposed anesthesia with the patient or authorized representative who has indicated his/her understanding and acceptance.   Dental advisory given  Plan Discussed with: CRNA  Anesthesia Plan Comments:         Anesthesia Quick Evaluation

## 2015-05-08 ENCOUNTER — Ambulatory Visit (HOSPITAL_COMMUNITY): Payer: Medicare Other | Admitting: Anesthesiology

## 2015-05-08 ENCOUNTER — Encounter (HOSPITAL_COMMUNITY)
Admission: RE | Disposition: A | Discharge status: Home / Self Care | Payer: Self-pay | Source: Ambulatory Visit | Attending: Gastroenterology

## 2015-05-08 ENCOUNTER — Encounter (HOSPITAL_COMMUNITY): Payer: Self-pay | Admitting: Registered Nurse

## 2015-05-08 ENCOUNTER — Ambulatory Visit (HOSPITAL_COMMUNITY)
Admission: RE | Admit: 2015-05-08 | Discharge: 2015-05-08 | Disposition: A | Discharge status: Home / Self Care | Payer: Medicare Other | Source: Ambulatory Visit | Attending: Gastroenterology | Admitting: Gastroenterology

## 2015-05-08 DIAGNOSIS — E039 Hypothyroidism, unspecified: Secondary | ICD-10-CM | POA: Diagnosis not present

## 2015-05-08 DIAGNOSIS — Z86010 Personal history of colonic polyps: Secondary | ICD-10-CM | POA: Insufficient documentation

## 2015-05-08 DIAGNOSIS — E780 Pure hypercholesterolemia: Secondary | ICD-10-CM | POA: Insufficient documentation

## 2015-05-08 DIAGNOSIS — E119 Type 2 diabetes mellitus without complications: Secondary | ICD-10-CM | POA: Insufficient documentation

## 2015-05-08 DIAGNOSIS — Z800 Family history of malignant neoplasm of digestive organs: Secondary | ICD-10-CM | POA: Insufficient documentation

## 2015-05-08 DIAGNOSIS — F17210 Nicotine dependence, cigarettes, uncomplicated: Secondary | ICD-10-CM | POA: Diagnosis not present

## 2015-05-08 DIAGNOSIS — Z1211 Encounter for screening for malignant neoplasm of colon: Secondary | ICD-10-CM | POA: Diagnosis not present

## 2015-05-08 HISTORY — PX: COLONOSCOPY WITH PROPOFOL: SHX5780

## 2015-05-08 HISTORY — DX: Hypothyroidism, unspecified: E03.9

## 2015-05-08 LAB — GLUCOSE, CAPILLARY: Glucose-Capillary: 102 mg/dL — ABNORMAL HIGH (ref 65–99)

## 2015-05-08 SURGERY — COLONOSCOPY WITH PROPOFOL
Anesthesia: Monitor Anesthesia Care

## 2015-05-08 MED ORDER — LIDOCAINE HCL (CARDIAC) 20 MG/ML IV SOLN
INTRAVENOUS | Status: DC | PRN
  Administered 2015-05-08: 100 mg via INTRAVENOUS

## 2015-05-08 MED ORDER — PROPOFOL 10 MG/ML IV BOLUS
INTRAVENOUS | Status: AC
  Filled 2015-05-08: qty 20

## 2015-05-08 MED ORDER — PROPOFOL INFUSION 10 MG/ML OPTIME
INTRAVENOUS | Status: DC | PRN
  Administered 2015-05-08: 120 ug/kg/min via INTRAVENOUS

## 2015-05-08 MED ORDER — LACTATED RINGERS IV SOLN
INTRAVENOUS | Status: DC
  Administered 2015-05-08: 12:00:00 via INTRAVENOUS

## 2015-05-08 MED ORDER — SODIUM CHLORIDE 0.9 % IV SOLN: INTRAVENOUS | Status: DC

## 2015-05-08 MED ORDER — EPHEDRINE SULFATE 50 MG/ML IJ SOLN
INTRAMUSCULAR | Status: DC | PRN
  Administered 2015-05-08: 10 mg via INTRAVENOUS

## 2015-05-08 MED ORDER — LIDOCAINE HCL (CARDIAC) 20 MG/ML IV SOLN
INTRAVENOUS | Status: AC
  Filled 2015-05-08: qty 5

## 2015-05-08 SURGICAL SUPPLY — 21 items

## 2015-05-08 NOTE — Discharge Instructions (Signed)
Colonoscopy, Care After °These instructions give you information on caring for yourself after your procedure. Your doctor may also give you more specific instructions. Call your doctor if you have any problems or questions after your procedure. °HOME CARE °· Do not drive for 24 hours. °· Do not sign important papers or use machinery for 24 hours. °· You may shower. °· You may go back to your usual activities, but go slower for the first 24 hours. °· Take rest breaks often during the first 24 hours. °· Walk around or use warm packs on your belly (abdomen) if you have belly cramping or gas. °· Drink enough fluids to keep your pee (urine) clear or pale yellow. °· Resume your normal diet. Avoid heavy or fried foods. °· Avoid drinking alcohol for 24 hours or as told by your doctor. °· Only take medicines as told by your doctor. °If a tissue sample (biopsy) was taken during the procedure:  °· Do not take aspirin or blood thinners for 7 days, or as told by your doctor. °· Do not drink alcohol for 7 days, or as told by your doctor. °· Eat soft foods for the first 24 hours. °GET HELP IF: °You still have a small amount of blood in your poop (stool) 2-3 days after the procedure. °GET HELP RIGHT AWAY IF: °· You have more than a small amount of blood in your poop. °· You see clumps of tissue (blood clots) in your poop. °· Your belly is puffy (swollen). °· You feel sick to your stomach (nauseous) or throw up (vomit). °· You have a fever. °· You have belly pain that gets worse and medicine does not help. °MAKE SURE YOU: °· Understand these instructions. °· Will watch your condition. °· Will get help right away if you are not doing well or get worse. °Document Released: 01/10/2011 Document Revised: 12/13/2013 Document Reviewed: 08/15/2013 °Exit[employer]® Patient Information ©2015 [employer]. This information is not intended to replace advice given to you by your health care provider. Make sure you discuss any questions you have with  your health care provider. ° °

## 2015-05-08 NOTE — Transfer of Care (Signed)
Immediate Anesthesia Transfer of Care Note  Patient: Tristan Maxwell  Procedure(s) Performed: Procedure(s): COLONOSCOPY WITH PROPOFOL (N/A)  Patient Location: PACU and Endoscopy Unit  Anesthesia Type:MAC  Level of Consciousness: awake, alert , oriented and patient cooperative  Airway & Oxygen Therapy: Patient Spontanous Breathing and Patient connected to face mask oxygen  Post-op Assessment: Report given to RN, Post -op Vital signs reviewed and stable and Patient moving all extremities  Post vital signs: Reviewed and stable  Last Vitals:  Filed Vitals:   05/08/15 1150  BP: 115/61  Resp: 16    Complications: No apparent anesthesia complications

## 2015-05-08 NOTE — Op Note (Signed)
Procedure: Surveillance colonoscopy. Adenomatous colon polyps removed colonoscopically in April 17, 2004 and 2009/04/17. Brother died of colon cancer  Endoscopist: Earle Gell  Premedication: Propofol administered by anesthesia  Procedure: The patient was placed in the left lateral decubitus position. Anal inspection and digital rectal exam were normal. The Pentax pediatric colonoscope was introduced into the rectum and advanced to the cecum. A normal-appearing appendiceal orifice and ileocecal valve were identified. Colonic preparation for the exam today was good. Withdrawal time was 9 minutes  Rectum. Normal. Retroflexed view of the distal rectum was normal  Sigmoid colon and descending colon. Normal  Splenic flexure. Normal  Transverse colon. Normal  Hepatic flexure. Normal  Ascending colon. Normal.  Cecum and ileocecal valve. Normal  Assessment: Normal surveillance colonoscopy

## 2015-05-08 NOTE — Anesthesia Postprocedure Evaluation (Signed)
  Anesthesia Post-op Note  Patient: Tristan Maxwell  Procedure(s) Performed: Procedure(s) (LRB): COLONOSCOPY WITH PROPOFOL (N/A)  Patient Location: PACU  Anesthesia Type: MAC  Level of Consciousness: awake and alert   Airway and Oxygen Therapy: Patient Spontanous Breathing  Post-op Pain: mild  Post-op Assessment: Post-op Vital signs reviewed, Patient's Cardiovascular Status Stable, Respiratory Function Stable, Patent Airway and No signs of Nausea or vomiting  Last Vitals:  Filed Vitals:   05/08/15 1405  BP:   Pulse: 66  Resp: 14    Post-op Vital Signs: stable   Complications: No apparent anesthesia complications

## 2015-05-08 NOTE — H&P (Signed)
  Procedure: Surveillance colonoscopy. Adenomatous colon polyps removed colonoscopically in April 18, 2004 and 2009-04-18. Brother died of colon cancer  History: The patient is a 76 male born 06-14-1934. He is scheduled to undergo a surveillance colonoscopy today.  Medication allergies: None  Past medical history: Type 2 diabetes mellitus. Hypercholesterolemia. Hypothyroidism.  Family history: Brother diagnosed with colon cancer  Exam: The patient is alert and lying comfortably on the endoscopy stretcher. Abdomen is soft and nontender to palpation. Lungs are clear to auscultation. Cardiac exam reveals a regular rhythm.  Plan: Proceed with surveillance colonoscopy

## 2015-05-08 NOTE — Anesthesia Procedure Notes (Signed)
Procedure Name: MAC Date/Time: 05/08/2015 1:06 PM Performed by: Carleene Cooper A Pre-anesthesia Checklist: Patient identified, Timeout performed, Emergency Drugs available, Suction available and Patient being monitored Patient Re-evaluated:Patient Re-evaluated prior to inductionDental Injury: Teeth and Oropharynx as per pre-operative assessment

## 2015-05-09 ENCOUNTER — Encounter (HOSPITAL_COMMUNITY): Payer: Self-pay | Admitting: Gastroenterology

## 2015-06-06 DIAGNOSIS — E119 Type 2 diabetes mellitus without complications: Secondary | ICD-10-CM | POA: Diagnosis not present

## 2015-07-18 ENCOUNTER — Encounter (HOSPITAL_COMMUNITY): Payer: Self-pay | Admitting: Family Medicine

## 2015-07-18 ENCOUNTER — Emergency Department (HOSPITAL_COMMUNITY): Payer: Medicare Other

## 2015-07-18 ENCOUNTER — Ambulatory Visit: Payer: Medicare Other | Admitting: Cardiovascular Disease

## 2015-07-18 ENCOUNTER — Observation Stay (HOSPITAL_COMMUNITY)
Admission: EM | Admit: 2015-07-18 | Discharge: 2015-07-19 | Disposition: A | Discharge status: Home / Self Care | Payer: Medicare Other | Attending: Cardiovascular Disease | Admitting: Cardiovascular Disease

## 2015-07-18 ENCOUNTER — Telehealth: Payer: Self-pay | Admitting: Cardiovascular Disease

## 2015-07-18 DIAGNOSIS — R0789 Other chest pain: Secondary | ICD-10-CM | POA: Diagnosis present

## 2015-07-18 DIAGNOSIS — Z7982 Long term (current) use of aspirin: Secondary | ICD-10-CM | POA: Insufficient documentation

## 2015-07-18 DIAGNOSIS — R5383 Other fatigue: Secondary | ICD-10-CM | POA: Diagnosis not present

## 2015-07-18 DIAGNOSIS — E119 Type 2 diabetes mellitus without complications: Secondary | ICD-10-CM | POA: Diagnosis not present

## 2015-07-18 DIAGNOSIS — I447 Left bundle-branch block, unspecified: Secondary | ICD-10-CM | POA: Diagnosis not present

## 2015-07-18 DIAGNOSIS — R0602 Shortness of breath: Secondary | ICD-10-CM | POA: Diagnosis not present

## 2015-07-18 DIAGNOSIS — R079 Chest pain, unspecified: Secondary | ICD-10-CM | POA: Diagnosis not present

## 2015-07-18 DIAGNOSIS — Z720 Tobacco use: Secondary | ICD-10-CM | POA: Insufficient documentation

## 2015-07-18 DIAGNOSIS — Z79899 Other long term (current) drug therapy: Secondary | ICD-10-CM | POA: Diagnosis not present

## 2015-07-18 DIAGNOSIS — E039 Hypothyroidism, unspecified: Secondary | ICD-10-CM | POA: Insufficient documentation

## 2015-07-18 DIAGNOSIS — Z9889 Other specified postprocedural states: Secondary | ICD-10-CM | POA: Insufficient documentation

## 2015-07-18 DIAGNOSIS — E785 Hyperlipidemia, unspecified: Secondary | ICD-10-CM | POA: Insufficient documentation

## 2015-07-18 DIAGNOSIS — R072 Precordial pain: Secondary | ICD-10-CM | POA: Diagnosis not present

## 2015-07-18 HISTORY — DX: Other chest pain: R07.89

## 2015-07-18 LAB — TROPONIN I: Troponin I: <0.03 ng/mL (ref <0.031)

## 2015-07-18 LAB — CREATININE, SERUM
CREATININE: 1.2 mg/dL (ref 0.61–1.24)
GFR calc Af Amer: >60 mL/min (ref >60)
GFR, EST NON AFRICAN AMERICAN: 56 mL/min — AB (ref >60)

## 2015-07-18 LAB — BASIC METABOLIC PANEL
Anion gap: 9 (ref 5–15)
BUN: 14 mg/dL (ref 6–20)
CALCIUM: 9.1 mg/dL (ref 8.9–10.3)
CHLORIDE: 104 mmol/L (ref 101–111)
CO2: 23 mmol/L (ref 22–32)
Creatinine, Ser: 1.05 mg/dL (ref 0.61–1.24)
GFR calc Af Amer: >60 mL/min (ref >60)
GFR calc non Af Amer: >60 mL/min (ref >60)
Glucose, Bld: 202 mg/dL — ABNORMAL HIGH (ref 65–99)
Potassium: 4.3 mmol/L (ref 3.5–5.1)
SODIUM: 136 mmol/L (ref 135–145)

## 2015-07-18 LAB — CBC
HEMATOCRIT: 39.9 % (ref 39.0–52.0)
HEMATOCRIT: 38.6 % — AB (ref 39.0–52.0)
HEMOGLOBIN: 13.6 g/dL (ref 13.0–17.0)
HEMOGLOBIN: 13.2 g/dL (ref 13.0–17.0)
MCH: 32.2 pg (ref 26.0–34.0)
MCH: 32.1 pg (ref 26.0–34.0)
MCHC: 34.1 g/dL (ref 30.0–36.0)
MCHC: 34.2 g/dL (ref 30.0–36.0)
MCV: 94.3 fL (ref 78.0–100.0)
MCV: 93.9 fL (ref 78.0–100.0)
PLATELETS: 220 10*3/uL (ref 150–400)
Platelets: 199 10*3/uL (ref 150–400)
RBC: 4.23 MIL/uL (ref 4.22–5.81)
RBC: 4.11 MIL/uL — ABNORMAL LOW (ref 4.22–5.81)
RDW: 12.7 % (ref 11.5–15.5)
RDW: 12.7 % (ref 11.5–15.5)
WBC: 5.4 10*3/uL (ref 4.0–10.5)
WBC: 4.7 10*3/uL (ref 4.0–10.5)

## 2015-07-18 LAB — GLUCOSE, CAPILLARY: GLUCOSE-CAPILLARY: 169 mg/dL — AB (ref 65–99)

## 2015-07-18 LAB — BRAIN NATRIURETIC PEPTIDE: B Natriuretic Peptide: 29.7 pg/mL (ref 0.0–100.0)

## 2015-07-18 LAB — D-DIMER, QUANTITATIVE: D-Dimer, Quant: 0.38 ug/mL-FEU (ref 0.00–0.48)

## 2015-07-18 LAB — I-STAT TROPONIN, ED: Troponin i, poc: 0 ng/mL (ref 0.00–0.08)

## 2015-07-18 MED ORDER — GLIMEPIRIDE 1 MG PO TABS: 1.0000 mg | ORAL_TABLET | Freq: Every day | ORAL | Status: DC

## 2015-07-18 MED ORDER — LEVOTHYROXINE SODIUM 25 MCG PO TABS
125.0000 ug | ORAL_TABLET | Freq: Every day | ORAL | Status: DC
  Administered 2015-07-19: 125 ug via ORAL
  Filled 2015-07-18 (×2): qty 1

## 2015-07-18 MED ORDER — PRAVASTATIN SODIUM 20 MG PO TABS
10.0000 mg | ORAL_TABLET | Freq: Once | ORAL | Status: AC
  Administered 2015-07-18: 10 mg via ORAL
  Filled 2015-07-18: qty 1

## 2015-07-18 MED ORDER — NITROGLYCERIN 0.4 MG SL SUBL: 0.4000 mg | SUBLINGUAL_TABLET | SUBLINGUAL | Status: DC | PRN

## 2015-07-18 MED ORDER — ASPIRIN EC 81 MG PO TBEC
81.0000 mg | DELAYED_RELEASE_TABLET | Freq: Every day | ORAL | Status: DC
  Administered 2015-07-19: 81 mg via ORAL
  Filled 2015-07-18: qty 1

## 2015-07-18 MED ORDER — NITROGLYCERIN 0.4 MG SL SUBL
0.4000 mg | SUBLINGUAL_TABLET | SUBLINGUAL | Status: DC | PRN
Start: 2015-07-18 — End: 2015-07-18
  Administered 2015-07-18 (×2): 0.4 mg via SUBLINGUAL
  Filled 2015-07-18: qty 1

## 2015-07-18 MED ORDER — HEPARIN SODIUM (PORCINE) 5000 UNIT/ML IJ SOLN: 5000.0000 [IU] | Freq: Three times a day (TID) | INTRAMUSCULAR | Status: DC

## 2015-07-18 MED ORDER — SODIUM CHLORIDE 0.9 % IJ SOLN: 3.0000 mL | INTRAMUSCULAR | Status: DC | PRN

## 2015-07-18 MED ORDER — SODIUM CHLORIDE 0.9 % IV SOLN: 250.0000 mL | INTRAVENOUS | Status: DC | PRN

## 2015-07-18 MED ORDER — REGADENOSON 0.4 MG/5ML IV SOLN
0.4000 mg | Freq: Once | INTRAVENOUS | Status: AC
  Administered 2015-07-19: 0.4 mg via INTRAVENOUS
  Filled 2015-07-18: qty 5

## 2015-07-18 MED ORDER — GLIMEPIRIDE 1 MG PO TABS
1.0000 mg | ORAL_TABLET | Freq: Every day | ORAL | Status: DC
  Administered 2015-07-19: 1 mg via ORAL
  Filled 2015-07-18: qty 1

## 2015-07-18 MED ORDER — PRAVASTATIN SODIUM 20 MG PO TABS: 10.0000 mg | ORAL_TABLET | Freq: Every evening | ORAL | Status: DC

## 2015-07-18 MED ORDER — ACETAMINOPHEN 325 MG PO TABS: 650.0000 mg | ORAL_TABLET | ORAL | Status: DC | PRN

## 2015-07-18 MED ORDER — ONDANSETRON HCL 4 MG/2ML IJ SOLN: 4.0000 mg | Freq: Four times a day (QID) | INTRAMUSCULAR | Status: DC | PRN

## 2015-07-18 MED ORDER — VITAMIN D 1000 UNITS PO TABS
1000.0000 [IU] | ORAL_TABLET | Freq: Every day | ORAL | Status: DC
Start: 2015-07-18 — End: 2015-07-18

## 2015-07-18 MED ORDER — VITAMIN D 1000 UNITS PO TABS
1000.0000 [IU] | ORAL_TABLET | Freq: Every day | ORAL | Status: DC
  Administered 2015-07-19: 1000 [IU] via ORAL
  Filled 2015-07-18: qty 1

## 2015-07-18 MED ORDER — SODIUM CHLORIDE 0.9 % IJ SOLN
3.0000 mL | Freq: Two times a day (BID) | INTRAMUSCULAR | Status: DC
  Administered 2015-07-19: 3 mL via INTRAVENOUS

## 2015-07-18 NOTE — ED Provider Notes (Signed)
CSN: [id number]     Arrival date & time 07/18/15  1324 History   None    Chief Complaint  Patient presents with  . Chest Pain   (Consider location/radiation/quality/duration/timing/severity/associated sxs/prior Treatment) HPI  Patient is a 81 year old male with a history of hyperlipidemia, type 2 diabetes, and hypothyroidism presented today for chest pain. Patient awoke with a left-sided chest pressure radiating to the axilla yesterday. Reports mild increase with breathing. Denies any similar chest pressure with exertion over the past several weeks. Denies any fevers coughs, cold, congestion. Denies any alleviating or aggravating factors at this time. Currently reports pain as a 2 out of 10 with it rated as 5 out of 10 at its maximum. Denies any radiation to the back neck or arm. Denies any frank palpitations or near syncopal episodes.  Past Medical History  Diagnosis Date  . Hyperlipidemia   . Type 2 diabetes mellitus   . Left bundle branch block     a. new 02/2015.  Marland Kitchen Hypothyroidism   . Midsternal chest pain     a. 1994 nonobs cath;  b. 09/2000 cath: LM nl, LAD 30-40d, D1 40-50ost, LCX nl, RCA dominant, nl, EF 60%;  c. 01/2013 Neg MV.   Past Surgical History  Procedure Laterality Date  . Cardiac catheterization  09/2000    NON-CRITICAL CAD  . Myocardial perfusion study  01/26/13    INFERIOR-INFEROSEPTAL PERFUSION DEFECT CONSISTANT WITH DIAPHRAGMATIC ATTENUATION.  . Carotid doppler  01/26/13    BILATERAL ICA;NORMAL PATENCY. DISTAL ICA ARE TORTUOUS. THE DISTAL LICA MAKES A COMPLETE 360 DEGREE LOOP.  Marland Kitchen Cholecystectomy    . Hernia repair      right inguinal  . Colonoscopy with propofol N/A 05/08/2015    Procedure: COLONOSCOPY WITH PROPOFOL;  Surgeon: Garlan Fair, MD;  Location: [facility];  Service: Endoscopy;  Laterality: N/A;   History reviewed. No pertinent family history. History  Substance Use Topics  . Smoking status: Light Tobacco Smoker    Types: Cigars  . Smokeless  tobacco: Current User     Comment: cigars one or two a month  . Alcohol Use: Yes     Comment: Occas    Review of Systems  Constitutional: Positive for fatigue. Negative for fever, chills and diaphoresis.  HENT: Negative for congestion and sore throat.   Eyes: Negative for pain.  Respiratory: Positive for shortness of breath. Negative for cough, choking and chest tightness.   Cardiovascular: Positive for chest pain. Negative for palpitations and leg swelling.  Gastrointestinal: Negative for nausea, vomiting, abdominal pain and diarrhea.  Endocrine: Negative.   Genitourinary: Negative for flank pain.  Musculoskeletal: Negative for back pain and neck pain.  Skin: Negative for rash.  Allergic/Immunologic: Negative.   Neurological: Negative for dizziness, syncope and light-headedness.  Psychiatric/Behavioral: Negative for confusion.    Allergies  Review of patient's allergies indicates no known allergies.  Home Medications   Prior to Admission medications   Medication Sig Start Date End Date Taking? Authorizing Provider  aspirin 325 MG tablet Take 325 mg by mouth daily.   Yes Historical Provider, MD  B Complex Vitamins (VITAMIN B COMPLEX PO) Take 1 tablet by mouth daily.   Yes Historical Provider, MD  cholecalciferol (VITAMIN D) 1000 UNITS tablet Take 1,000 Units by mouth daily.   Yes Historical Provider, MD  Coenzyme Q10 200 MG capsule Take 200 mg by mouth daily.   Yes Historical Provider, MD  fluticasone (FLONASE) 50 MCG/ACT nasal spray Place 1 spray into both nostrils  daily as needed for allergies.  01/13/14  Yes Historical Provider, MD  glimepiride (AMARYL) 1 MG tablet Take 1 mg by mouth daily. 02/09/14  Yes Historical Provider, MD  levothyroxine (SYNTHROID, LEVOTHROID) 125 MCG tablet Take 125 mcg by mouth daily before breakfast.  12/06/13  Yes Historical Provider, MD  metFORMIN (GLUCOPHAGE-XR) 500 MG 24 hr tablet Take 500 mg by mouth 2 (two) times daily. 02/09/14  Yes Historical  Provider, MD  pravastatin (PRAVACHOL) 10 MG tablet Take 10 mg by mouth every evening.   Yes Historical Provider, MD  Probiotic Product (PROBIOTIC DAILY PO) Take 1 capsule by mouth daily.   Yes Historical Provider, MD  Saw Palmetto, Serenoa repens, (SAW PALMETTO PO) Take 1 capsule by mouth daily.   Yes Historical Provider, MD  rosuvastatin (CRESTOR) 5 MG tablet Take 1 tablet (5 mg total) by mouth every other day. Patient not taking: Reported on 07/18/2015 03/08/14   Lorretta Harp, MD   BP 125/83 mmHg  Pulse 61  Temp(Src) 98.1 F (36.7 C) (Oral)  Resp 15  Ht 5\' 11"  (1.803 m)  Wt 150 lb 9.6 oz (68.312 kg)  BMI 21.01 kg/m2  SpO2 100% Physical Exam  Constitutional: He is oriented to person, place, and time. He appears well-developed and well-nourished.  HENT:  Head: Normocephalic and atraumatic.  Eyes: Conjunctivae and EOM are normal. Pupils are equal, round, and reactive to light.  Neck: Normal range of motion. Neck supple.  Cardiovascular: Normal rate, regular rhythm, normal heart sounds and intact distal pulses.   No murmur heard. Pulses:      Radial pulses are 2+ on the right side, and 2+ on the left side.  Pulmonary/Chest: Effort normal and breath sounds normal. No respiratory distress.  Abdominal: Soft. Bowel sounds are normal. There is no tenderness.  Musculoskeletal: Normal range of motion.  Neurological: He is alert and oriented to person, place, and time. He has normal reflexes. No cranial nerve deficit.  Skin: Skin is warm and dry.    ED Course  Procedures (including critical care time) Labs Review Labs Reviewed  BASIC METABOLIC PANEL - Abnormal; Notable for the following:    Glucose, Bld 202 (*)    All other components within normal limits  CBC - Abnormal; Notable for the following:    RBC 4.11 (*)    HCT 38.6 (*)    All other components within normal limits  CREATININE, SERUM - Abnormal; Notable for the following:    GFR calc non Af Amer 56 (*)    All other  components within normal limits  GLUCOSE, CAPILLARY - Abnormal; Notable for the following:    Glucose-Capillary 169 (*)    All other components within normal limits  CBC  BRAIN NATRIURETIC PEPTIDE  D-DIMER, QUANTITATIVE (NOT AT [facility])  TROPONIN I  LIPID PANEL  TROPONIN I  TROPONIN I  COMPREHENSIVE METABOLIC PANEL  I-STAT TROPOININ, ED    Imaging Review Dg Chest 2 View  07/18/2015   CLINICAL DATA:  Lower chest pain and short of breath for 1 day  EXAM: CHEST  2 VIEW  COMPARISON:  None.  FINDINGS: Normal heart size. Clear lungs. No pneumothorax or pleural effusion.  IMPRESSION: No active cardiopulmonary disease.   Electronically Signed   By: Marybelle Killings M.D.   On: 07/18/2015 14:30     EKG Interpretation   Date/Time:  Wednesday July 18 2015 13:40:02 EDT Ventricular Rate:  75 PR Interval:  204 QRS Duration: 146 QT Interval:  426 QTC Calculation:  475 R Axis:   61 Text Interpretation:  Normal sinus rhythm Left bundle branch block  Abnormal ECG No old tracing to compare Confirmed by KNAPP  MD-J, JON  ([id number]) on 07/18/2015 3:47:48 PM      MDM   Final diagnoses:  Midsternal chest pain  Hypothyroidism, unspecified hypothyroidism type    On initial evaluation patient was hemodynamically stable and in no acute distress. Given nitroglycerin in the ED with mild alleviation of pain. EKG with normal sinus rhythm left bundle. Troponin negative. CXR with no widened mediastinum, PNA, or PTX.  Equal pulses bilaterally.  CBC with no leukocytosis or anemia. Due to continued chest pressure and story consistent with cardiac etiology cardiology consulted and Dr. Acie Fredrickson evaluated pt in the ED.  Pt subsequently admitted to cardiology for further monitoring and CP workup.   If performed, labs, EKGs, and imaging were reviewed/interpreted by myself and my attending and incorporated into medical decision making.  Discussed pertinent finding with patient or caregiver prior to admission with no further  questions.  Pt care supervised by my attending Dr. Junius Roads, MD PGY-2  Emergency Medicine     Geronimo Boot, MD 07/20/15 1517  Dorie Rank, MD 07/21/15 [phone]

## 2015-07-18 NOTE — Telephone Encounter (Signed)
Returned call to patient.  Left side of chest has been "heavy", occurred yesterday AM. Seemed to resolve somewhat yesterday w/ meds. He did not go to ER due to not wanting to "cry wolf". This problem returned this morning. He is short of breath, esp with ambulation.  Advised him ER evaluation recommended - he voiced understanding. He wanted to make sure [name] is aware of what is going on. Informed him i would let [name] know.  Pt acknowledged instructions, is going to [facility] ED for eval. Wannetta Sender has been informed.

## 2015-07-18 NOTE — ED Notes (Signed)
Pt here for chest heaviness and SOB with exertion since yesterday.

## 2015-07-18 NOTE — H&P (Signed)
Admission  NOTE  Date: 07/18/2015               Patient Name:  Tristan Maxwell MRN: [id number]  DOB: Apr 23, 1934 Age / Sex: 81 y.o., male        PCP: Leonides Sake Primary Cardiologist: Gwenlyn Found             Referring Physician: Tomi Bamberger              Reason for Consult: Chest pain           History of Present Illness: Patient is a 81 y.o. male with a PMHx of hyperlipidemia, type 2 diabetes,LBBB  and hypothyroidism presented today for chest pain. Patient awoke with a left-sided chest pressure radiating to the axilla yesterday. Reports mild increase with breathing, who was admitted to [facility] on 07/18/2015 for evaluation of chest pain.   Has had CP off and on since yesterday . Pressure , left sided , radiating to left arm, associated with dyspnea, no diaphoresis. Started when he was walking in the yard.  Has had several caths - non obstructive disease .  Cathin 2001 revealed  LAD - 30-40%, 1st diag had a 40-50% ostial stenosis LCx- minor irreg RCA - large ,dominant, normal. LVG - normal LV function with EF 60%.    Goes to the gym 3 days a week. Mows yards, Has not done much since yesterday because of the cp  Stress myoview in Feb. 2014 was negative for ischemia   Medications: Outpatient medications:  (Not in a hospital admission)  Current medications: Current Facility-Administered Medications  Medication Dose Route Frequency Provider Last Rate Last Dose  . nitroGLYCERIN (NITROSTAT) SL tablet 0.4 mg  0.4 mg Sublingual Q5 min PRN Geronimo Boot, MD   Stopped at 07/18/15 1655   Current Outpatient Prescriptions  Medication Sig Dispense Refill  . aspirin 325 MG tablet Take 325 mg by mouth daily.    . B Complex Vitamins (VITAMIN B COMPLEX PO) Take 1 tablet by mouth daily.    . cholecalciferol (VITAMIN D) 1000 UNITS tablet Take 1,000 Units by mouth daily.    . Coenzyme Q10 200 MG capsule Take 200 mg by mouth daily.    . fluticasone (FLONASE) 50 MCG/ACT nasal spray Place 1  spray into both nostrils daily as needed for allergies.     Marland Kitchen glimepiride (AMARYL) 1 MG tablet Take 1 mg by mouth daily.    Marland Kitchen levothyroxine (SYNTHROID, LEVOTHROID) 125 MCG tablet Take 125 mcg by mouth daily before breakfast.     . metFORMIN (GLUCOPHAGE-XR) 500 MG 24 hr tablet Take 500 mg by mouth 2 (two) times daily.    . pravastatin (PRAVACHOL) 10 MG tablet Take 10 mg by mouth every evening.    . Probiotic Product (PROBIOTIC DAILY PO) Take 1 capsule by mouth daily.    . Saw Palmetto, Serenoa repens, (SAW PALMETTO PO) Take 1 capsule by mouth daily.    . rosuvastatin (CRESTOR) 5 MG tablet Take 1 tablet (5 mg total) by mouth every other day. (Patient not taking: Reported on 07/18/2015) 25 tablet 0     No Known Allergies   Past Medical History  Diagnosis Date  . Hyperlipidemia   . Type 2 diabetes mellitus   . Left bundle branch block     a. new 02/2015.  Marland Kitchen Hypothyroidism   . Chest pain     a. 1994 nonobs cath;  b. 09/2000 cath: LM nl, LAD 30-40d, D1 40-50ost, LCX  nl, RCA dominant, nl, EF 60%;  c. 01/2013 Neg MV.    Past Surgical History  Procedure Laterality Date  . Cardiac catheterization  09/2000    NON-CRITICAL CAD  . Myocardial perfusion study  01/26/13    INFERIOR-INFEROSEPTAL PERFUSION DEFECT CONSISTANT WITH DIAPHRAGMATIC ATTENUATION.  . Carotid doppler  01/26/13    BILATERAL ICA;NORMAL PATENCY. DISTAL ICA ARE TORTUOUS. THE DISTAL LICA MAKES A COMPLETE 360 DEGREE LOOP.  Marland Kitchen Cholecystectomy    . Hernia repair      right inguinal  . Colonoscopy with propofol N/A 05/08/2015    Procedure: COLONOSCOPY WITH PROPOFOL;  Surgeon: Garlan Fair, MD;  Location: [facility];  Service: Endoscopy;  Laterality: N/A;    History reviewed. No pertinent family history.  Social History:  reports that he has been smoking Cigars.  He uses smokeless tobacco. He reports that he drinks alcohol. He reports that he does not use illicit drugs.   Review of Systems: Constitutional:  denies fever, chills,  diaphoresis, appetite change and fatigue.  HEENT: denies photophobia, eye pain, redness, hearing loss, ear pain, congestion, sore throat, rhinorrhea, sneezing, neck pain, neck stiffness and tinnitus.  Respiratory: denies SOB, DOE, cough, chest tightness, and wheezing.  Cardiovascular: admits to chest pain,  Denies any palpitations or  leg swelling.  Gastrointestinal: denies nausea, vomiting, abdominal pain, diarrhea, constipation, blood in stool.  Genitourinary: denies dysuria, urgency, frequency, hematuria, flank pain and difficulty urinating.  Musculoskeletal: denies  myalgias, back pain, joint swelling, arthralgias and gait problem.   Skin: denies pallor, rash and wound.  Neurological: denies dizziness, seizures, syncope, weakness, light-headedness, numbness and headaches.   Hematological: denies adenopathy, easy bruising, personal or family bleeding history.  Psychiatric/ Behavioral: denies suicidal ideation, mood changes, confusion, nervousness, sleep disturbance and agitation.    Physical Exam: BP 106/64 mmHg  Pulse 62  Temp(Src) 98.9 F (37.2 C) (Oral)  Resp 17  SpO2 99%  Wt Readings from Last 3 Encounters:  03/09/15 70.852 kg (156 lb 3.2 oz)  03/08/14 73.074 kg (161 lb 1.6 oz)  01/26/13 75.751 kg (167 lb)    General: Vital signs reviewed and noted. Well-developed, well-nourished, in no acute distress; alert,    Head: Normocephalic, atraumatic, sclera anicteric,    Neck: Supple. Negative for carotid bruits. No JVD    Lungs:  Clear bilaterally, no  wheezes, rales, or rhonchi. Breathing is normal    Heart: RRR with S1 S2. No murmurs, rubs, or gallops   Abdomen/ GI :  Soft, non-tender, non-distended with normoactive bowel sounds. No hepatomegaly. No rebound/guarding. No obvious abdominal masses   MSK: Strength and the appear normal for age.   Extremities: No clubbing or cyanosis. No edema.  Distal pedal pulses are 2+ and equal   Neurologic:  CN are grossly intact,  No  obvious motor or sensory defect.  Alert and oriented X 3. Moves all extremities spontaneously.  Psych: Responds to questions appropriately with a normal affect.     Lab results: Basic Metabolic Panel:  Recent Labs Lab 07/18/15 1350  NA 136  K 4.3  CL 104  CO2 23  GLUCOSE 202*  BUN 14  CREATININE 1.05  CALCIUM 9.1    Liver Function Tests: No results for input(s): AST, ALT, ALKPHOS, BILITOT, PROT, ALBUMIN in the last 168 hours. No results for input(s): LIPASE, AMYLASE in the last 168 hours. No results for input(s): AMMONIA in the last 168 hours.  CBC:  Recent Labs Lab 07/18/15 1350  WBC 5.4  HGB 13.6  HCT 39.9  MCV 94.3  PLT 220    Cardiac Enzymes: No results for input(s): CKTOTAL, CKMB, CKMBINDEX, TROPONINI in the last 168 hours.  BNP: Invalid input(s): POCBNP  CBG: No results for input(s): GLUCAP in the last 168 hours.  Coagulation Studies: No results for input(s): LABPROT, INR in the last 72 hours.   Other results:  Personal review of EKG shows : LBBB    Imaging: Dg Chest 2 View  07/18/2015   CLINICAL DATA:  Lower chest pain and short of breath for 1 day  EXAM: CHEST  2 VIEW  COMPARISON:  None.  FINDINGS: Normal heart size. Clear lungs. No pneumothorax or pleural effusion.  IMPRESSION: No active cardiopulmonary disease.   Electronically Signed   By: Marybelle Killings M.D.   On: 07/18/2015 14:30     Assessment & Plan:  1. Chest discomfort: Pt presents with some symptoms that are worrisome for CAD. Has has had CP - sounds like off and on for several days. He did get relief with SL NTG. Has had a cath in 2001 that revealed mild - moderate disease. myoview in 2014 was normal .   The patient should be admitted for observation. We'll check cardiac enzymes. We'll schedule him for a stress Myoview study tomorrow.  If his troponins turn positive then we will schedule him for a cardiac catheterization instead of Myoview stress test.  2. Left bundle block:  This is been evaluated in the past. It appears to be stable.   Thayer Headings, Brooke Bonito., MD, [facility] 07/18/2015, 5:50 PM Office - [phone] Pager [phone]

## 2015-07-18 NOTE — Telephone Encounter (Signed)
Having some heaviness on his left side  Beneath the breast area under the rib cage , tried to walk at least 50 ft and had shortness of breath states he had to breathe fast to get oxygen. Please call .

## 2015-07-19 ENCOUNTER — Other Ambulatory Visit: Payer: Self-pay

## 2015-07-19 ENCOUNTER — Observation Stay (HOSPITAL_COMMUNITY): Payer: Medicare Other

## 2015-07-19 DIAGNOSIS — R072 Precordial pain: Secondary | ICD-10-CM

## 2015-07-19 DIAGNOSIS — I252 Old myocardial infarction: Secondary | ICD-10-CM

## 2015-07-19 DIAGNOSIS — R5383 Other fatigue: Secondary | ICD-10-CM | POA: Diagnosis not present

## 2015-07-19 DIAGNOSIS — E039 Hypothyroidism, unspecified: Secondary | ICD-10-CM | POA: Diagnosis not present

## 2015-07-19 DIAGNOSIS — R079 Chest pain, unspecified: Secondary | ICD-10-CM | POA: Diagnosis not present

## 2015-07-19 DIAGNOSIS — R0602 Shortness of breath: Secondary | ICD-10-CM | POA: Diagnosis not present

## 2015-07-19 DIAGNOSIS — I2510 Atherosclerotic heart disease of native coronary artery without angina pectoris: Secondary | ICD-10-CM

## 2015-07-19 LAB — LIPID PANEL
Cholesterol: 179 mg/dL (ref 0–200)
HDL: 66 mg/dL (ref >40)
LDL CALC: 76 mg/dL (ref 0–99)
Total CHOL/HDL Ratio: 2.7 RATIO
Triglycerides: 186 mg/dL — ABNORMAL HIGH (ref <150)
VLDL: 37 mg/dL (ref 0–40)

## 2015-07-19 LAB — COMPREHENSIVE METABOLIC PANEL
ALBUMIN: 3.7 g/dL (ref 3.5–5.0)
ALT: 18 U/L (ref 17–63)
ANION GAP: 5 (ref 5–15)
AST: 22 U/L (ref 15–41)
Alkaline Phosphatase: 45 U/L (ref 38–126)
BILIRUBIN TOTAL: 0.8 mg/dL (ref 0.3–1.2)
BUN: 15 mg/dL (ref 6–20)
CALCIUM: 9.3 mg/dL (ref 8.9–10.3)
CHLORIDE: 106 mmol/L (ref 101–111)
CO2: 28 mmol/L (ref 22–32)
Creatinine, Ser: 1.08 mg/dL (ref 0.61–1.24)
GLUCOSE: 140 mg/dL — AB (ref 65–99)
Potassium: 4.9 mmol/L (ref 3.5–5.1)
Sodium: 139 mmol/L (ref 135–145)
Total Protein: 6.4 g/dL — ABNORMAL LOW (ref 6.5–8.1)

## 2015-07-19 LAB — TROPONIN I
Troponin I: <0.03 ng/mL (ref <0.031)
Troponin I: <0.03 ng/mL (ref <0.031)

## 2015-07-19 LAB — GLUCOSE, CAPILLARY: GLUCOSE-CAPILLARY: 133 mg/dL — AB (ref 65–99)

## 2015-07-19 MED ORDER — TECHNETIUM TC 99M SESTAMIBI GENERIC - CARDIOLITE
10.0000 | Freq: Once | INTRAVENOUS | Status: AC | PRN
  Administered 2015-07-19: 10 via INTRAVENOUS

## 2015-07-19 MED ORDER — REGADENOSON 0.4 MG/5ML IV SOLN
INTRAVENOUS | Status: AC
  Administered 2015-07-19: 0.4 mg via INTRAVENOUS
  Filled 2015-07-19: qty 5

## 2015-07-19 MED ORDER — TECHNETIUM TC 99M SESTAMIBI GENERIC - CARDIOLITE
30.0000 | Freq: Once | INTRAVENOUS | Status: AC | PRN
  Administered 2015-07-19: 30 via INTRAVENOUS

## 2015-07-19 NOTE — Discharge Summary (Signed)
Physician Discharge Summary     Cardiologist:  Gwenlyn Found Patient ID: Tristan Maxwell MRN: [id number] DOB/AGE: [birth year] 79 y.o.  Admit date: 07/18/2015 Discharge date: 07/19/2015  Admission Diagnoses:  Chest pain  Discharge Diagnoses:  Active Problems:   Midsternal chest pain   Discharged Condition: stable  Hospital Course:  Patient is a 81 y.o. male with a PMHx of hyperlipidemia, type 2 diabetes,LBBB and hypothyroidism presented today for chest pain. Patient awoke with a left-sided chest pressure radiating to the axilla yesterday. Reports mild increase with breathing, who was admitted to [facility] on 07/18/2015 for evaluation of chest pain.   Has had CP off and on since yesterday.  Pressure, left sided, radiating to left arm, associated with dyspnea, no  Diaphoresis. Started when he was walking in the yard.  Goes to the gym 3 days a week. Mows yards.  Has not done much since yesterday because of the cp.   Has had several caths - non obstructive disease . Cathin 2001 revealed  LAD - 30-40%, 1st diag had a 40-50% ostial stenosis LCx- minor irreg RCA - large ,dominant, normal. LVG - normal LV function with EF 60%.   Stress myoview in Feb. 2014 was negative for ischemia  He was admitted and ruled out for MI.  Lexiscan stress test was negative.  Chest pain resolved. BP controlled.  On statin.  The patient was seen by [name] who felt he was stable for DC home.   Consults: None  Significant Diagnostic Studies:  MYOCARDIAL IMAGING WITH SPECT (REST AND PHARMACOLOGIC-STRESS)  GATED LEFT VENTRICULAR WALL MOTION STUDY  LEFT VENTRICULAR EJECTION FRACTION  TECHNIQUE: Standard myocardial SPECT imaging was performed after resting intravenous injection of 10 mCi Tc-13m sestamibi. Subsequently, intravenous infusion of Lexiscan was performed under the supervision of the Cardiology staff. At peak effect of the drug, 30 mCi Tc-69m sestamibi was injected intravenously and standard  myocardial SPECT imaging was performed. Quantitative gated imaging was also performed to evaluate left ventricular wall motion, and estimate left ventricular ejection fraction.  COMPARISON: Two-view chest x-ray 07/18/2015  FINDINGS: Perfusion: No decreased activity in the left ventricle on stress imaging to suggest reversible ischemia or infarction. There is matched hypoperfusion along the inferior and septal segments.  Wall Motion: There is some hypokinesis along the septum inferior wall of the heart. Contraction  the wall motion is otherwise normal.  Left Ventricular Ejection Fraction: 50 %  End diastolic volume 93 ml  End systolic volume 46 ml  IMPRESSION: 1. No reversible ischemia. Decreased uptake along the inferior wall and septum suggests a remote infarct. This corresponds with hypokinesia in the same area. By report decreased uptake in these areas was attributed to diaphragmatic attenuation. This does not appear to be present on today's study.  2. Hypokinesia involving the inferior wall and septum.  3. Left ventricular ejection fraction 50%  4. Low-risk stress test findings*.  Treatments: See above  Discharge Exam: Blood pressure 119/73, pulse 54, temperature 97.7 F (36.5 C), temperature source Oral, resp. rate 15, height 5\' 11"  (1.803 m), weight 150 lb 9.6 oz (68.312 kg), SpO2 98 %.   Disposition: 01-Home or Self Care      Discharge Instructions    Diet - low sodium heart healthy    Complete by:  As directed             Medication List    STOP taking these medications        rosuvastatin 5 MG tablet  Commonly known as:  CRESTOR      TAKE these medications        aspirin 325 MG tablet  Take 325 mg by mouth daily.     cholecalciferol 1000 UNITS tablet  Commonly known as:  VITAMIN D  Take 1,000 Units by mouth daily.     Coenzyme Q10 200 MG capsule  Take 200 mg by mouth daily.     fluticasone 50 MCG/ACT nasal spray  Commonly  known as:  FLONASE  Place 1 spray into both nostrils daily as needed for allergies.     glimepiride 1 MG tablet  Commonly known as:  AMARYL  Take 1 mg by mouth daily.     levothyroxine 125 MCG tablet  Commonly known as:  SYNTHROID, LEVOTHROID  Take 125 mcg by mouth daily before breakfast.     metFORMIN 500 MG 24 hr tablet  Commonly known as:  GLUCOPHAGE-XR  Take 500 mg by mouth 2 (two) times daily.     pravastatin 10 MG tablet  Commonly known as:  PRAVACHOL  Take 10 mg by mouth every evening.     PROBIOTIC DAILY PO  Take 1 capsule by mouth daily.     SAW PALMETTO PO  Take 1 capsule by mouth daily.     VITAMIN B COMPLEX PO  Take 1 tablet by mouth daily.       Greater than 30 minutes was spent completing the patient's discharge.   Follow-up Information    Follow up with Lyda Jester, PA-C On 08/20/2015.   Specialties:  Cardiology, Radiology   Why:  1:30 PM   Contact information:   [address] [place] [address] [phone]       Signed: Tarri Fuller, [facility] 07/19/2015, 2:54 PM  I have examined the patient and reviewed assessment and plan and discussed with patient.  Agree with above as stated.  If CP persists, may need angiogram.  At this point, he had a low risk stress test.  Okay to discharge with follow-up planned with [name].  Stoy Fenn S.

## 2015-07-19 NOTE — Progress Notes (Signed)
    Subjective: Still complaining of 1/10 chest pressure.  Objective: Vital signs in last 24 hours: Temp:  [97.7 F (36.5 C)-98.9 F (37.2 C)] 97.7 F (36.5 C) (07/28 0549) Pulse Rate:  [53-76] 54 (07/28 0549) Resp:  [12-21] 15 (07/27 2000) BP: (102-142)/(59-83) 120/59 mmHg (07/28 0549) SpO2:  [94 %-100 %] 98 % (07/28 0549) Weight:  [150 lb 9.6 oz (68.312 kg)] 150 lb 9.6 oz (68.312 kg) (07/27 2000) Last BM Date: 07/18/15  Intake/Output from previous day:   Intake/Output this shift:    Medications Scheduled Meds: . aspirin EC  81 mg Oral Daily  . cholecalciferol  1,000 Units Oral Daily  . glimepiride  1 mg Oral Q breakfast  . heparin  5,000 Units Subcutaneous 3 times per day  . levothyroxine  125 mcg Oral QAC breakfast  . pravastatin  10 mg Oral QPM  . regadenoson      . regadenoson  0.4 mg Intravenous Once  . sodium chloride  3 mL Intravenous Q12H   Continuous Infusions:  PRN Meds:.sodium chloride, acetaminophen, nitroGLYCERIN, ondansetron (ZOFRAN) IV, sodium chloride  PE: General appearance: alert, cooperative and no distress Lungs: clear to auscultation bilaterally Heart: regular rate and rhythm, S1, S2 normal, no murmur, click, rub or gallop Extremities: No LEE Pulses: 2+ and symmetric Skin: Warm and dry Neurologic: Grossly normal  Lab Results:   Recent Labs  07/18/15 1350 07/18/15 2121  WBC 5.4 4.7  HGB 13.6 13.2  HCT 39.9 38.6*  PLT 220 199   BMET  Recent Labs  07/18/15 1350 07/18/15 2121 07/19/15 0846  NA 136  --  139  K 4.3  --  4.9  CL 104  --  106  CO2 23  --  28  GLUCOSE 202*  --  140*  BUN 14  --  15  CREATININE 1.05 1.20 1.08  CALCIUM 9.1  --  9.3   PT/INR No results for input(s): LABPROT, INR in the last 72 hours. Cholesterol  Recent Labs  07/19/15 0216  CHOL 179   Lipid Panel     Component Value Date/Time   CHOL 179 07/19/2015 0216   TRIG 186* 07/19/2015 0216   HDL 66 07/19/2015 0216   CHOLHDL 2.7 07/19/2015 0216     VLDL 37 07/19/2015 0216   LDLCALC 76 07/19/2015 0216   Cardiac Panel (last 3 results)  Recent Labs  07/18/15 2121 07/19/15 0216 07/19/15 0846  TROPONINI <0.03 <0.03 <0.03     Assessment/Plan   Active Problems:   Midsternal chest pain   LBBB   DM   HLD  Ruled out for MI.  Continues to have 1/10 chest pressure.  He tolerated the Lexiscan very well with just some mild nausea.  BP controlled.  On statin.     Tarri Fuller PA-C 07/19/2015 11:21 AM  I have examined the patient and reviewed assessment and plan and discussed with patient.  Agree with above as stated.  Feels much better.  Pain now resolved.  I encouraged him to contact [name] if his sx return.  At this time, he wishes to go home. He should f/u with [name].  He has a known prior MI and CTO with collaterals based on cath in 1993 per his report.  Cath in 2001 did not include PCI.   Dema Timmons S.

## 2015-07-19 NOTE — Progress Notes (Signed)
Pt discharged home with family.  Reviewed discharge instructions and education.  All questions answered, assessment unchanged from earlier.  

## 2015-08-20 ENCOUNTER — Ambulatory Visit (INDEPENDENT_AMBULATORY_CARE_PROVIDER_SITE_OTHER): Payer: Medicare Other | Admitting: Cardiology

## 2015-08-20 ENCOUNTER — Encounter: Payer: Self-pay | Admitting: Cardiology

## 2015-08-20 VITALS — BP 129/76 | HR 69 | Ht 71.0 in | Wt 154.8 lb

## 2015-08-20 DIAGNOSIS — I2510 Atherosclerotic heart disease of native coronary artery without angina pectoris: Secondary | ICD-10-CM | POA: Diagnosis not present

## 2015-08-20 NOTE — Progress Notes (Signed)
08/20/2015 Tristan Maxwell   01/16/1934  [id number]  [name], MD [name]  Reason for Visit/CC: Post hospital follow-up for chest pain  HPI:  The patient is a 81 year old male, followed by [name], who presents to clinic today for post hospital follow-up. His past medical history is significant for hyperlipidemia, type 2 diabetes, chronic left bundle branch block and hypothyroidism. In 2001, he underwent a left heart catheterization which revealed nonobstructive disease. At that time, he was noted to have a 30-40% LAD lesion. The first diagonal had a 40-50% ostial stenosis. The left circumflex only had minor irregularities. The RCA was normal. Left ventricular function at that time was normal at 60%. In Feb. 2014 he underwent a stress Myoview which was negative for ischemia.  On 07/18/2015, he presented to [facility] with a chief complaint of chest pain. Initial EKG in the ED showed no ischemic abnormalities. However he was admitted for further observation/workup. Cardiac enzymes were cycled 3 and were negative. EF was 50%. He then underwent a Lexiscan nuclear stress test which was negative for ischemia. His chest pain eventually resolved spontaneously. His blood pressure was well-controlled. He was maintained on statin therapy. He was seen by [name] who felt he was stable for discharge home.  He now presents to clinic for post hospital follow-up. Since discharge from the hospital he reports that he has done fairly well. He denies any further chest pain. No dyspnea. He reports full medication compliance.    Current Outpatient Prescriptions  Medication Sig Dispense Refill  . aspirin 325 MG tablet Take 325 mg by mouth daily.    . B Complex Vitamins (VITAMIN B COMPLEX PO) Take 1 tablet by mouth daily.    . cholecalciferol (VITAMIN D) 1000 UNITS tablet Take 1,000 Units by mouth daily.    . Coenzyme Q10 200 MG capsule Take 200 mg by  mouth daily.    . fluticasone (FLONASE) 50 MCG/ACT nasal spray Place 1 spray into both nostrils daily as needed for allergies.     Marland Kitchen glimepiride (AMARYL) 1 MG tablet Take 1 mg by mouth daily.    Marland Kitchen levothyroxine (SYNTHROID, LEVOTHROID) 125 MCG tablet Take 125 mcg by mouth daily before breakfast.     . metFORMIN (GLUCOPHAGE-XR) 500 MG 24 hr tablet Take 500 mg by mouth 2 (two) times daily.    . pravastatin (PRAVACHOL) 10 MG tablet Take 10 mg by mouth every evening.    . Probiotic Product (PROBIOTIC DAILY PO) Take 1 capsule by mouth daily.    . Saw Palmetto, Serenoa repens, (SAW PALMETTO PO) Take 1 capsule by mouth daily.     No current facility-administered medications for this visit.    No Known Allergies  Social History   Social History  . Marital Status: Married    Spouse Name: N/A  . Number of Children: N/A  . Years of Education: N/A   Occupational History  . Not on file.   Social History Main Topics  . Smoking status: Light Tobacco Smoker    Types: Cigars  . Smokeless tobacco: Current User     Comment: cigars one or two a month  . Alcohol Use: 0.0 oz/week    0 Standard drinks or equivalent per week     Comment: Occas  . Drug Use: No  . Sexual Activity: Not on file   Other Topics Concern  . Not on file   Social History Narrative     Review  of Systems: General: negative for chills, fever, night sweats or weight changes.  Cardiovascular: negative for chest pain, dyspnea on exertion, edema, orthopnea, palpitations, paroxysmal nocturnal dyspnea or shortness of breath Dermatological: negative for rash Respiratory: negative for cough or wheezing Urologic: negative for hematuria Abdominal: negative for nausea, vomiting, diarrhea, bright red blood per rectum, melena, or hematemesis Neurologic: negative for visual changes, syncope, or dizziness All other systems reviewed and are otherwise negative except as noted above.    Blood pressure 129/76, pulse 69, height 5\' 11"   (1.803 m), weight 154 lb 12.8 oz (70.217 kg).  General appearance: alert, cooperative and no distress Neck: no carotid bruit and no JVD Lungs: clear to auscultation bilaterally Heart: regular rate and rhythm, S1, S2 normal, no murmur, click, rub or gallop Extremities: no LEE Pulses: 2+ and symmetric Skin: warm and dry Neurologic: Grossly normal  EKG Not performed   ASSESSMENT AND PLAN:   1. CAD: Non obstructive CAD noted on left heart catheterization in 2001. Recent chest pain evaluation at [facility] was negative, including negative negative cardiac enzymes 3 and negative nuclear stress test. No recurrent CP. BP is well controlled. Continue medical therapy, ASA + statin.    2. Hyperlipidemia: Continue statin therapy with pravastatin. Recent LDL 07/19/15 was well controlled at 76 mg/DL.   3. Type 2 diabetes: On metformin and glimepiride. Continue management by PCP.    PLAN  Patient doing well from a cardiac standpoint. HR, BP and cholesterol well controlled. Continue medical therapy and f/u with [name] in 6 months.   [name] 08/20/2015 1:34 PM

## 2015-08-20 NOTE — Patient Instructions (Signed)
Your physician recommends that you schedule a follow-up appointment in: March with [name]. No changes have been made today.

## 2015-09-21 DIAGNOSIS — E1149 Type 2 diabetes mellitus with other diabetic neurological complication: Secondary | ICD-10-CM | POA: Diagnosis not present

## 2015-09-21 DIAGNOSIS — E039 Hypothyroidism, unspecified: Secondary | ICD-10-CM | POA: Diagnosis not present

## 2015-09-21 DIAGNOSIS — E780 Pure hypercholesterolemia: Secondary | ICD-10-CM | POA: Diagnosis not present

## 2015-09-21 DIAGNOSIS — Z79899 Other long term (current) drug therapy: Secondary | ICD-10-CM | POA: Diagnosis not present

## 2015-09-21 DIAGNOSIS — E559 Vitamin D deficiency, unspecified: Secondary | ICD-10-CM | POA: Diagnosis not present

## 2015-09-25 DIAGNOSIS — Z1389 Encounter for screening for other disorder: Secondary | ICD-10-CM | POA: Diagnosis not present

## 2015-09-25 DIAGNOSIS — Z23 Encounter for immunization: Secondary | ICD-10-CM | POA: Diagnosis not present

## 2015-09-25 DIAGNOSIS — E1149 Type 2 diabetes mellitus with other diabetic neurological complication: Secondary | ICD-10-CM | POA: Diagnosis not present

## 2015-09-25 DIAGNOSIS — Z6821 Body mass index (BMI) 21.0-21.9, adult: Secondary | ICD-10-CM | POA: Diagnosis not present

## 2015-09-25 DIAGNOSIS — E039 Hypothyroidism, unspecified: Secondary | ICD-10-CM | POA: Diagnosis not present

## 2015-09-25 DIAGNOSIS — E781 Pure hyperglyceridemia: Secondary | ICD-10-CM | POA: Diagnosis not present

## 2015-09-25 DIAGNOSIS — E559 Vitamin D deficiency, unspecified: Secondary | ICD-10-CM | POA: Diagnosis not present

## 2015-12-18 DIAGNOSIS — M25551 Pain in right hip: Secondary | ICD-10-CM | POA: Diagnosis not present

## 2015-12-18 DIAGNOSIS — Z6822 Body mass index (BMI) 22.0-22.9, adult: Secondary | ICD-10-CM | POA: Diagnosis not present

## 2015-12-19 DIAGNOSIS — M25551 Pain in right hip: Secondary | ICD-10-CM | POA: Diagnosis not present

## 2015-12-19 DIAGNOSIS — M76891 Other specified enthesopathies of right lower limb, excluding foot: Secondary | ICD-10-CM | POA: Diagnosis not present

## 2015-12-25 DIAGNOSIS — M7061 Trochanteric bursitis, right hip: Secondary | ICD-10-CM | POA: Diagnosis not present

## 2015-12-25 DIAGNOSIS — N400 Enlarged prostate without lower urinary tract symptoms: Secondary | ICD-10-CM | POA: Diagnosis not present

## 2015-12-25 DIAGNOSIS — E785 Hyperlipidemia, unspecified: Secondary | ICD-10-CM | POA: Diagnosis not present

## 2015-12-25 DIAGNOSIS — E119 Type 2 diabetes mellitus without complications: Secondary | ICD-10-CM | POA: Diagnosis not present

## 2016-01-04 DIAGNOSIS — M7061 Trochanteric bursitis, right hip: Secondary | ICD-10-CM | POA: Diagnosis not present

## 2016-01-09 DIAGNOSIS — M6281 Muscle weakness (generalized): Secondary | ICD-10-CM | POA: Diagnosis not present

## 2016-01-09 DIAGNOSIS — R2689 Other abnormalities of gait and mobility: Secondary | ICD-10-CM | POA: Diagnosis not present

## 2016-01-09 DIAGNOSIS — M25551 Pain in right hip: Secondary | ICD-10-CM | POA: Diagnosis not present

## 2016-01-14 DIAGNOSIS — C44319 Basal cell carcinoma of skin of other parts of face: Secondary | ICD-10-CM | POA: Diagnosis not present

## 2016-01-14 DIAGNOSIS — D225 Melanocytic nevi of trunk: Secondary | ICD-10-CM | POA: Diagnosis not present

## 2016-01-14 DIAGNOSIS — D485 Neoplasm of uncertain behavior of skin: Secondary | ICD-10-CM | POA: Diagnosis not present

## 2016-01-14 DIAGNOSIS — R2689 Other abnormalities of gait and mobility: Secondary | ICD-10-CM | POA: Diagnosis not present

## 2016-01-14 DIAGNOSIS — Z85828 Personal history of other malignant neoplasm of skin: Secondary | ICD-10-CM | POA: Diagnosis not present

## 2016-01-14 DIAGNOSIS — D1801 Hemangioma of skin and subcutaneous tissue: Secondary | ICD-10-CM | POA: Diagnosis not present

## 2016-01-14 DIAGNOSIS — C44519 Basal cell carcinoma of skin of other part of trunk: Secondary | ICD-10-CM | POA: Diagnosis not present

## 2016-01-14 DIAGNOSIS — L821 Other seborrheic keratosis: Secondary | ICD-10-CM | POA: Diagnosis not present

## 2016-01-14 DIAGNOSIS — M25551 Pain in right hip: Secondary | ICD-10-CM | POA: Diagnosis not present

## 2016-01-14 DIAGNOSIS — L570 Actinic keratosis: Secondary | ICD-10-CM | POA: Diagnosis not present

## 2016-01-14 DIAGNOSIS — M6281 Muscle weakness (generalized): Secondary | ICD-10-CM | POA: Diagnosis not present

## 2016-01-16 DIAGNOSIS — M6281 Muscle weakness (generalized): Secondary | ICD-10-CM | POA: Diagnosis not present

## 2016-01-16 DIAGNOSIS — M25551 Pain in right hip: Secondary | ICD-10-CM | POA: Diagnosis not present

## 2016-01-16 DIAGNOSIS — R2689 Other abnormalities of gait and mobility: Secondary | ICD-10-CM | POA: Diagnosis not present

## 2016-01-22 DIAGNOSIS — R2689 Other abnormalities of gait and mobility: Secondary | ICD-10-CM | POA: Diagnosis not present

## 2016-01-22 DIAGNOSIS — M25551 Pain in right hip: Secondary | ICD-10-CM | POA: Diagnosis not present

## 2016-01-22 DIAGNOSIS — M6281 Muscle weakness (generalized): Secondary | ICD-10-CM | POA: Diagnosis not present

## 2016-01-24 DIAGNOSIS — M6281 Muscle weakness (generalized): Secondary | ICD-10-CM | POA: Diagnosis not present

## 2016-01-24 DIAGNOSIS — R2689 Other abnormalities of gait and mobility: Secondary | ICD-10-CM | POA: Diagnosis not present

## 2016-01-24 DIAGNOSIS — M25551 Pain in right hip: Secondary | ICD-10-CM | POA: Diagnosis not present

## 2016-01-31 DIAGNOSIS — R2689 Other abnormalities of gait and mobility: Secondary | ICD-10-CM | POA: Diagnosis not present

## 2016-01-31 DIAGNOSIS — M25551 Pain in right hip: Secondary | ICD-10-CM | POA: Diagnosis not present

## 2016-01-31 DIAGNOSIS — M6281 Muscle weakness (generalized): Secondary | ICD-10-CM | POA: Diagnosis not present

## 2016-02-12 DIAGNOSIS — M25551 Pain in right hip: Secondary | ICD-10-CM | POA: Diagnosis not present

## 2016-02-12 DIAGNOSIS — M6281 Muscle weakness (generalized): Secondary | ICD-10-CM | POA: Diagnosis not present

## 2016-02-12 DIAGNOSIS — R2689 Other abnormalities of gait and mobility: Secondary | ICD-10-CM | POA: Diagnosis not present

## 2016-02-15 DIAGNOSIS — M7061 Trochanteric bursitis, right hip: Secondary | ICD-10-CM | POA: Diagnosis not present

## 2016-02-21 DIAGNOSIS — J101 Influenza due to other identified influenza virus with other respiratory manifestations: Secondary | ICD-10-CM | POA: Diagnosis not present

## 2016-03-26 DIAGNOSIS — E1149 Type 2 diabetes mellitus with other diabetic neurological complication: Secondary | ICD-10-CM | POA: Diagnosis not present

## 2016-03-26 DIAGNOSIS — E039 Hypothyroidism, unspecified: Secondary | ICD-10-CM | POA: Diagnosis not present

## 2016-03-26 DIAGNOSIS — E559 Vitamin D deficiency, unspecified: Secondary | ICD-10-CM | POA: Diagnosis not present

## 2016-03-26 DIAGNOSIS — E7800 Pure hypercholesterolemia, unspecified: Secondary | ICD-10-CM | POA: Diagnosis not present

## 2016-03-26 DIAGNOSIS — Z125 Encounter for screening for malignant neoplasm of prostate: Secondary | ICD-10-CM | POA: Diagnosis not present

## 2016-03-31 DIAGNOSIS — E1149 Type 2 diabetes mellitus with other diabetic neurological complication: Secondary | ICD-10-CM | POA: Diagnosis not present

## 2016-03-31 DIAGNOSIS — Z9181 History of falling: Secondary | ICD-10-CM | POA: Diagnosis not present

## 2016-03-31 DIAGNOSIS — E039 Hypothyroidism, unspecified: Secondary | ICD-10-CM | POA: Diagnosis not present

## 2016-03-31 DIAGNOSIS — Z6822 Body mass index (BMI) 22.0-22.9, adult: Secondary | ICD-10-CM | POA: Diagnosis not present

## 2016-03-31 DIAGNOSIS — E559 Vitamin D deficiency, unspecified: Secondary | ICD-10-CM | POA: Diagnosis not present

## 2016-03-31 DIAGNOSIS — E7800 Pure hypercholesterolemia, unspecified: Secondary | ICD-10-CM | POA: Diagnosis not present

## 2016-06-03 DIAGNOSIS — Z7984 Long term (current) use of oral hypoglycemic drugs: Secondary | ICD-10-CM | POA: Diagnosis not present

## 2016-06-03 DIAGNOSIS — E119 Type 2 diabetes mellitus without complications: Secondary | ICD-10-CM | POA: Diagnosis not present

## 2016-06-03 DIAGNOSIS — H2513 Age-related nuclear cataract, bilateral: Secondary | ICD-10-CM | POA: Diagnosis not present

## 2016-06-03 DIAGNOSIS — H5203 Hypermetropia, bilateral: Secondary | ICD-10-CM | POA: Diagnosis not present

## 2016-06-03 DIAGNOSIS — H52223 Regular astigmatism, bilateral: Secondary | ICD-10-CM | POA: Diagnosis not present

## 2016-06-17 DIAGNOSIS — Z6822 Body mass index (BMI) 22.0-22.9, adult: Secondary | ICD-10-CM | POA: Diagnosis not present

## 2016-06-17 DIAGNOSIS — J3489 Other specified disorders of nose and nasal sinuses: Secondary | ICD-10-CM | POA: Diagnosis not present

## 2016-09-09 DIAGNOSIS — L821 Other seborrheic keratosis: Secondary | ICD-10-CM | POA: Diagnosis not present

## 2016-09-09 DIAGNOSIS — L570 Actinic keratosis: Secondary | ICD-10-CM | POA: Diagnosis not present

## 2016-09-09 DIAGNOSIS — D485 Neoplasm of uncertain behavior of skin: Secondary | ICD-10-CM | POA: Diagnosis not present

## 2016-09-09 DIAGNOSIS — Z85828 Personal history of other malignant neoplasm of skin: Secondary | ICD-10-CM | POA: Diagnosis not present

## 2016-09-09 DIAGNOSIS — C44219 Basal cell carcinoma of skin of left ear and external auricular canal: Secondary | ICD-10-CM | POA: Diagnosis not present

## 2016-09-09 DIAGNOSIS — C44311 Basal cell carcinoma of skin of nose: Secondary | ICD-10-CM | POA: Diagnosis not present

## 2016-09-15 DIAGNOSIS — M7061 Trochanteric bursitis, right hip: Secondary | ICD-10-CM | POA: Diagnosis not present

## 2016-09-25 DIAGNOSIS — E039 Hypothyroidism, unspecified: Secondary | ICD-10-CM | POA: Diagnosis not present

## 2016-09-25 DIAGNOSIS — I2510 Atherosclerotic heart disease of native coronary artery without angina pectoris: Secondary | ICD-10-CM | POA: Diagnosis not present

## 2016-09-25 DIAGNOSIS — E1149 Type 2 diabetes mellitus with other diabetic neurological complication: Secondary | ICD-10-CM | POA: Diagnosis not present

## 2016-09-25 DIAGNOSIS — E7800 Pure hypercholesterolemia, unspecified: Secondary | ICD-10-CM | POA: Diagnosis not present

## 2016-09-30 DIAGNOSIS — E7800 Pure hypercholesterolemia, unspecified: Secondary | ICD-10-CM | POA: Diagnosis not present

## 2016-09-30 DIAGNOSIS — E039 Hypothyroidism, unspecified: Secondary | ICD-10-CM | POA: Diagnosis not present

## 2016-09-30 DIAGNOSIS — I2510 Atherosclerotic heart disease of native coronary artery without angina pectoris: Secondary | ICD-10-CM | POA: Diagnosis not present

## 2016-09-30 DIAGNOSIS — Z6822 Body mass index (BMI) 22.0-22.9, adult: Secondary | ICD-10-CM | POA: Diagnosis not present

## 2016-09-30 DIAGNOSIS — Z1389 Encounter for screening for other disorder: Secondary | ICD-10-CM | POA: Diagnosis not present

## 2016-09-30 DIAGNOSIS — E1149 Type 2 diabetes mellitus with other diabetic neurological complication: Secondary | ICD-10-CM | POA: Diagnosis not present

## 2016-09-30 DIAGNOSIS — Z23 Encounter for immunization: Secondary | ICD-10-CM | POA: Diagnosis not present

## 2016-10-13 DIAGNOSIS — Z6821 Body mass index (BMI) 21.0-21.9, adult: Secondary | ICD-10-CM | POA: Diagnosis not present

## 2016-10-13 DIAGNOSIS — C44219 Basal cell carcinoma of skin of left ear and external auricular canal: Secondary | ICD-10-CM | POA: Diagnosis not present

## 2016-10-13 DIAGNOSIS — N410 Acute prostatitis: Secondary | ICD-10-CM | POA: Diagnosis not present

## 2016-10-13 DIAGNOSIS — Z85828 Personal history of other malignant neoplasm of skin: Secondary | ICD-10-CM | POA: Diagnosis not present

## 2016-10-21 DIAGNOSIS — Z85828 Personal history of other malignant neoplasm of skin: Secondary | ICD-10-CM | POA: Diagnosis not present

## 2016-10-21 DIAGNOSIS — C44311 Basal cell carcinoma of skin of nose: Secondary | ICD-10-CM | POA: Diagnosis not present

## 2016-10-27 DIAGNOSIS — Z4802 Encounter for removal of sutures: Secondary | ICD-10-CM | POA: Diagnosis not present

## 2016-10-28 DIAGNOSIS — M7061 Trochanteric bursitis, right hip: Secondary | ICD-10-CM | POA: Diagnosis not present

## 2017-01-15 DIAGNOSIS — D1801 Hemangioma of skin and subcutaneous tissue: Secondary | ICD-10-CM | POA: Diagnosis not present

## 2017-01-15 DIAGNOSIS — C44519 Basal cell carcinoma of skin of other part of trunk: Secondary | ICD-10-CM | POA: Diagnosis not present

## 2017-01-15 DIAGNOSIS — L570 Actinic keratosis: Secondary | ICD-10-CM | POA: Diagnosis not present

## 2017-01-15 DIAGNOSIS — Z85828 Personal history of other malignant neoplasm of skin: Secondary | ICD-10-CM | POA: Diagnosis not present

## 2017-01-15 DIAGNOSIS — C44619 Basal cell carcinoma of skin of left upper limb, including shoulder: Secondary | ICD-10-CM | POA: Diagnosis not present

## 2017-01-15 DIAGNOSIS — D485 Neoplasm of uncertain behavior of skin: Secondary | ICD-10-CM | POA: Diagnosis not present

## 2017-01-15 DIAGNOSIS — L821 Other seborrheic keratosis: Secondary | ICD-10-CM | POA: Diagnosis not present

## 2017-01-15 DIAGNOSIS — D225 Melanocytic nevi of trunk: Secondary | ICD-10-CM | POA: Diagnosis not present

## 2017-01-28 ENCOUNTER — Ambulatory Visit (INDEPENDENT_AMBULATORY_CARE_PROVIDER_SITE_OTHER): Payer: Medicare Other | Admitting: Cardiovascular Disease

## 2017-01-28 ENCOUNTER — Encounter: Payer: Self-pay | Admitting: Cardiovascular Disease

## 2017-01-28 VITALS — BP 130/68 | HR 74 | Ht 71.0 in | Wt 150.0 lb

## 2017-01-28 DIAGNOSIS — I447 Left bundle-branch block, unspecified: Secondary | ICD-10-CM

## 2017-01-28 DIAGNOSIS — E7800 Pure hypercholesterolemia, unspecified: Secondary | ICD-10-CM | POA: Diagnosis not present

## 2017-01-28 NOTE — Progress Notes (Signed)
01/28/2017 Tristan Maxwell   1934/03/16  [id number]  [name], MD Primary Cardiologist: Lorretta Harp MD Renae Gloss  HPI:  The patient is a very pleasant 83 year old thin and fit-appearing widowed Caucasian male, father of 78 and grandfather to 1 grandchild, whom I last saw in the office 03-24-15. His wife of 5 years died in 2008-04-05 after sustaining an injury from a fall. He has 1 daughter, who lives in [place], whom he sees once a year. He saw her on the [employer] this year. I catheterized him back in October 2001 revealing noncritical CAD with normal LV function. His prior catheterization before that was in 1993-04-05. He denies chest pain or shortness of breath.    Current Outpatient Prescriptions  Medication Sig Dispense Refill  . aspirin 325 MG tablet Take 325 mg by mouth daily.    . B Complex Vitamins (VITAMIN B COMPLEX PO) Take 1 tablet by mouth daily.    . cholecalciferol (VITAMIN D) 1000 UNITS tablet Take 1,000 Units by mouth daily.    . Coenzyme Q10 200 MG capsule Take 200 mg by mouth daily.    . fluticasone (FLONASE) 50 MCG/ACT nasal spray Place 1 spray into both nostrils daily as needed for allergies.     Marland Kitchen levothyroxine (SYNTHROID, LEVOTHROID) 125 MCG tablet Take 125 mcg by mouth daily before breakfast.     . metFORMIN (GLUCOPHAGE) 500 MG tablet Take 500 mg by mouth 3 (three) times daily.    . pravastatin (PRAVACHOL) 10 MG tablet Take 10 mg by mouth every other day.    . Probiotic Product (PROBIOTIC DAILY PO) Take 1 capsule by mouth daily.    . Saw Palmetto, Serenoa repens, (SAW PALMETTO PO) Take 1 capsule by mouth daily.     No current facility-administered medications for this visit.     No Known Allergies  Social History   Social History  . Marital status: Married    Spouse name: N/A  . Number of children: N/A  . Years of education: N/A   Occupational History  . Not on file.   Social History Main Topics  . Smoking  status: Light Tobacco Smoker    Types: Cigars  . Smokeless tobacco: Current User     Comment: cigars one or two a month  . Alcohol use 0.0 oz/week     Comment: Occas  . Drug use: No  . Sexual activity: Not on file   Other Topics Concern  . Not on file   Social History Narrative  . No narrative on file     Review of Systems: General: negative for chills, fever, night sweats or weight changes.  Cardiovascular: negative for chest pain, dyspnea on exertion, edema, orthopnea, palpitations, paroxysmal nocturnal dyspnea or shortness of breath Dermatological: negative for rash Respiratory: negative for cough or wheezing Urologic: negative for hematuria Abdominal: negative for nausea, vomiting, diarrhea, bright red blood per rectum, melena, or hematemesis Neurologic: negative for visual changes, syncope, or dizziness All other systems reviewed and are otherwise negative except as noted above.    Blood pressure 130/68, pulse 74, height 5\' 11"  (1.803 m), weight 150 lb (68 kg).  General appearance: alert and no distress Neck: no adenopathy, no carotid bruit, no JVD, supple, symmetrical, trachea midline and thyroid not enlarged, symmetric, no tenderness/mass/nodules Lungs: clear to auscultation bilaterally Heart: regular rate and rhythm, S1, S2 normal, no murmur, click, rub or gallop Extremities: extremities normal, atraumatic, no cyanosis or  edema  EKG sinus rhythm at 74 left bundle branch block unchanged from prior EKGs. I personally reviewed this EKG  ASSESSMENT AND PLAN:   Hyperlipidemia History of hyperlipidemia on statin therapy followed by his PCP  Left bundle branch block Chronic      Lorretta Harp MD [facility], [facility] 01/28/2017 4:29 PM

## 2017-01-28 NOTE — Assessment & Plan Note (Signed)
Chronic. 

## 2017-01-28 NOTE — Assessment & Plan Note (Signed)
History of hyperlipidemia on statin therapy followed by his PCP 

## 2017-01-28 NOTE — Patient Instructions (Signed)

## 2017-04-06 DIAGNOSIS — I2510 Atherosclerotic heart disease of native coronary artery without angina pectoris: Secondary | ICD-10-CM | POA: Diagnosis not present

## 2017-04-06 DIAGNOSIS — E7800 Pure hypercholesterolemia, unspecified: Secondary | ICD-10-CM | POA: Diagnosis not present

## 2017-04-06 DIAGNOSIS — Z125 Encounter for screening for malignant neoplasm of prostate: Secondary | ICD-10-CM | POA: Diagnosis not present

## 2017-04-06 DIAGNOSIS — E1149 Type 2 diabetes mellitus with other diabetic neurological complication: Secondary | ICD-10-CM | POA: Diagnosis not present

## 2017-04-06 DIAGNOSIS — E039 Hypothyroidism, unspecified: Secondary | ICD-10-CM | POA: Diagnosis not present

## 2017-04-08 DIAGNOSIS — E7800 Pure hypercholesterolemia, unspecified: Secondary | ICD-10-CM | POA: Diagnosis not present

## 2017-04-08 DIAGNOSIS — Z720 Tobacco use: Secondary | ICD-10-CM | POA: Diagnosis not present

## 2017-04-08 DIAGNOSIS — E1149 Type 2 diabetes mellitus with other diabetic neurological complication: Secondary | ICD-10-CM | POA: Diagnosis not present

## 2017-04-08 DIAGNOSIS — E039 Hypothyroidism, unspecified: Secondary | ICD-10-CM | POA: Diagnosis not present

## 2017-04-08 DIAGNOSIS — I2510 Atherosclerotic heart disease of native coronary artery without angina pectoris: Secondary | ICD-10-CM | POA: Diagnosis not present

## 2017-04-08 DIAGNOSIS — Z9181 History of falling: Secondary | ICD-10-CM | POA: Diagnosis not present

## 2017-05-01 IMAGING — CR DG CHEST 2V
2 series · 2 of 2 positions shown · non-contrast
Comparison: None.

CLINICAL DATA: Lower chest pain and short of breath for 1 day

EXAM:
CHEST  2 VIEW

[chest pa]
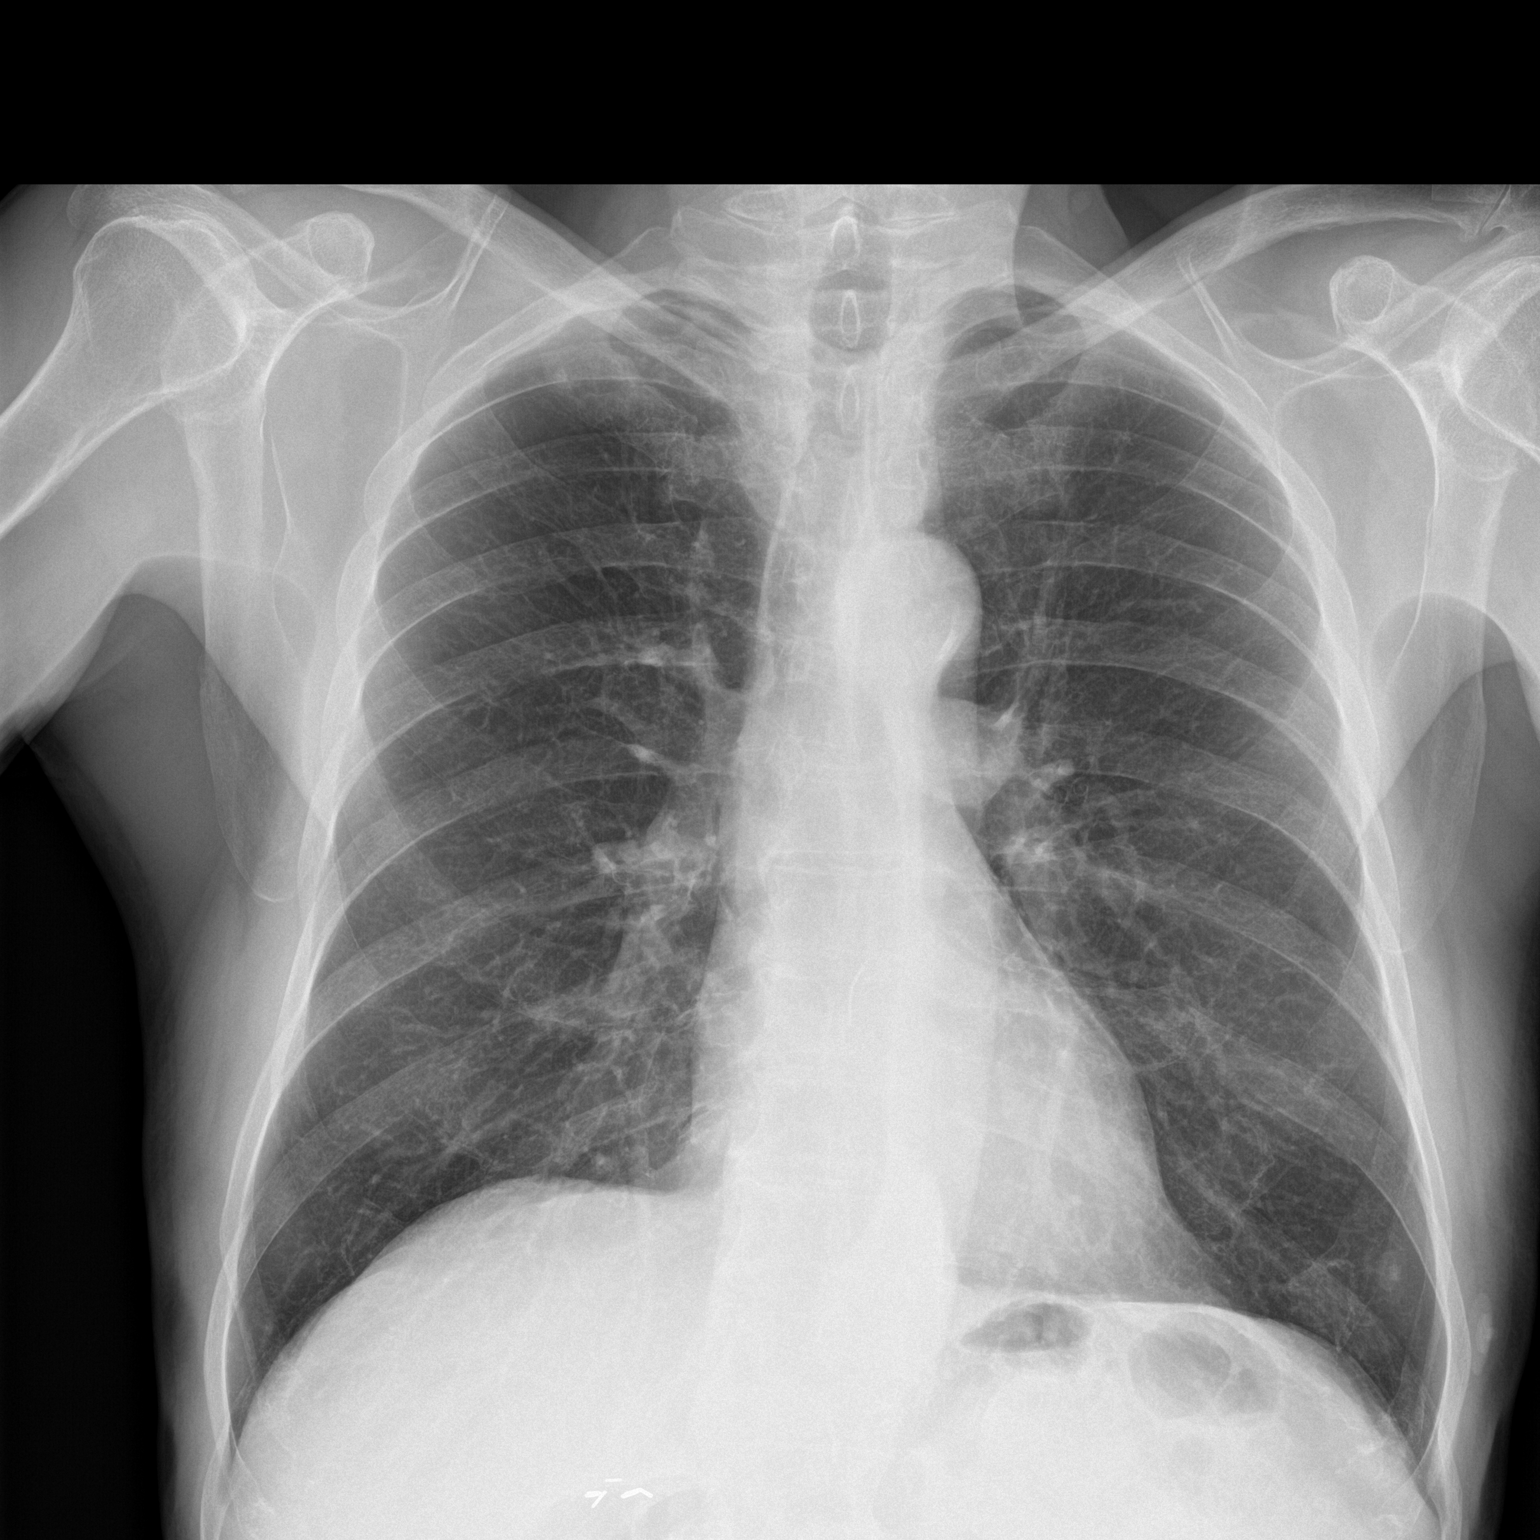

[chest lat]
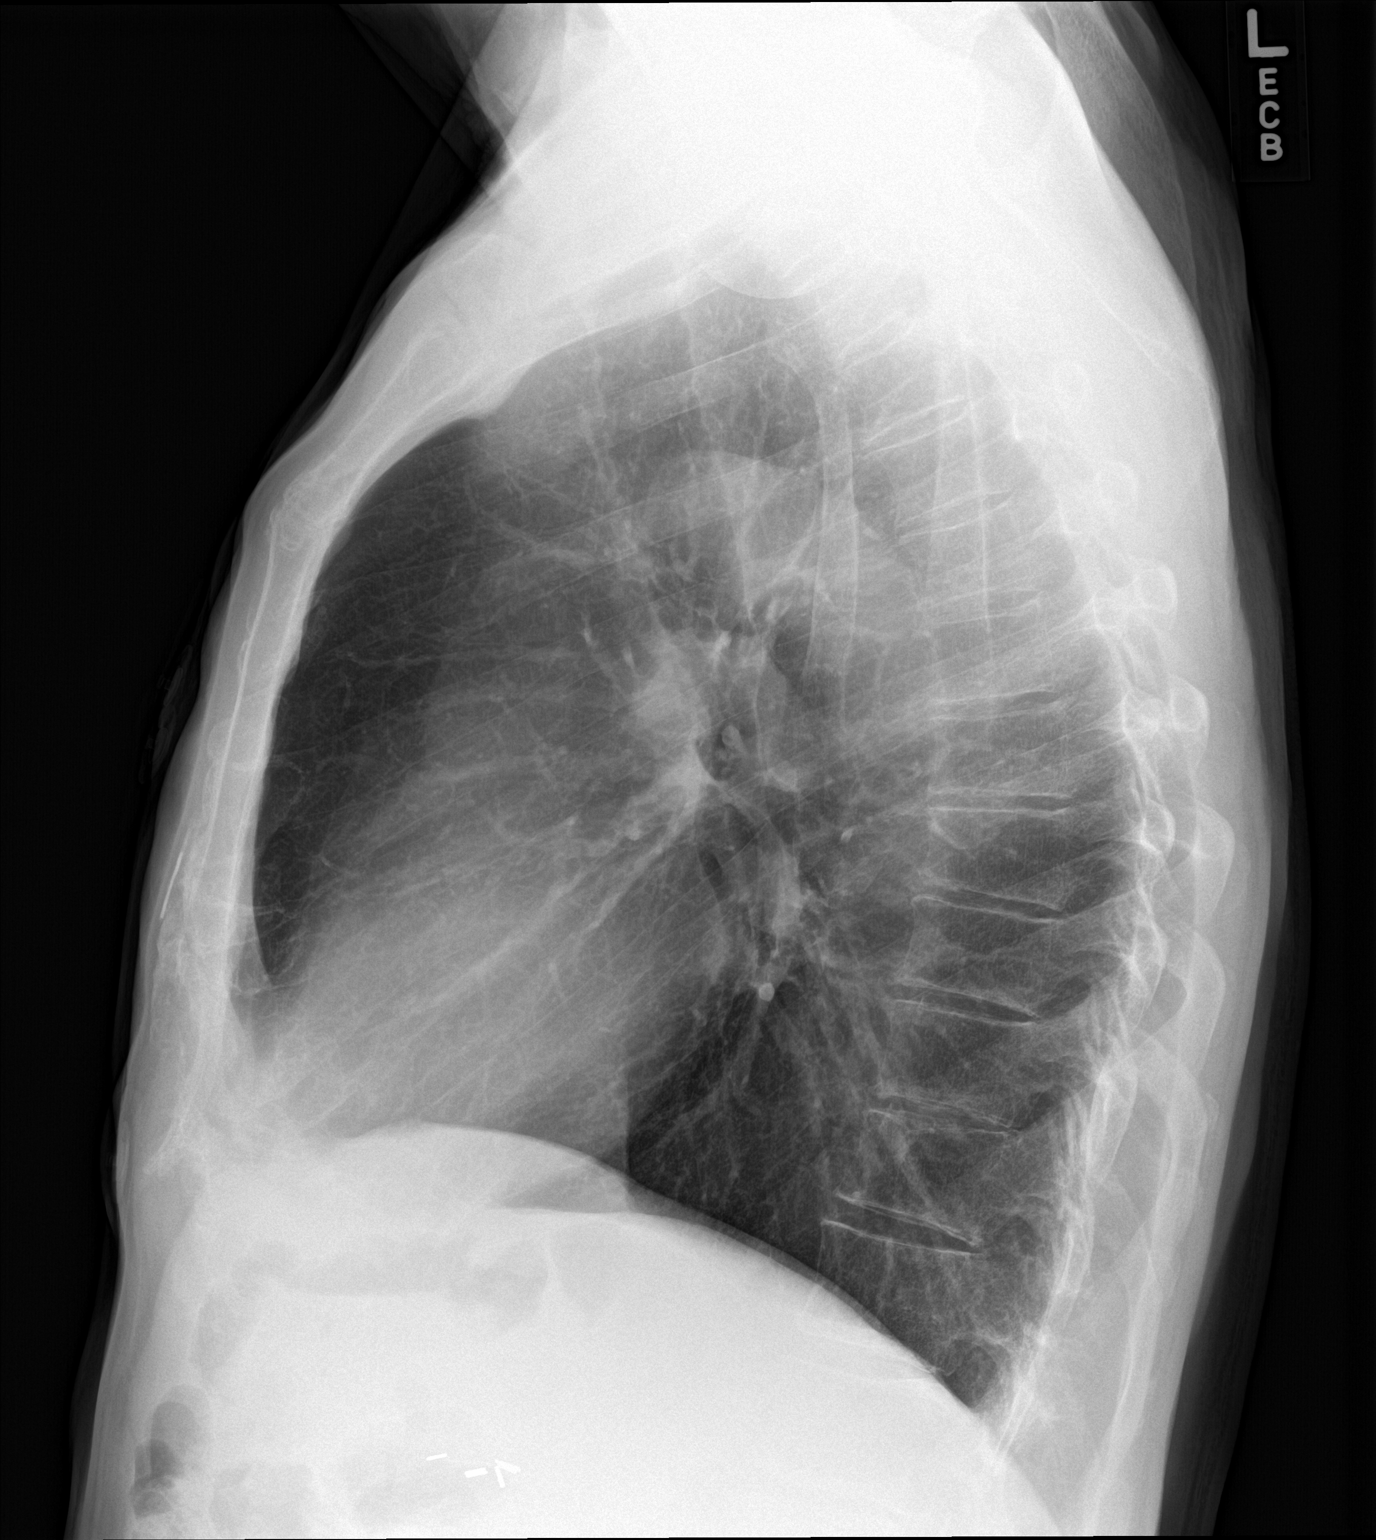

[2 of 2 positions shown; findings below may reference images not displayed]

FINDINGS: Normal heart size. Clear lungs. No pneumothorax or pleural effusion.
IMPRESSION: No active cardiopulmonary disease.

## 2017-06-04 DIAGNOSIS — Z7984 Long term (current) use of oral hypoglycemic drugs: Secondary | ICD-10-CM | POA: Diagnosis not present

## 2017-06-04 DIAGNOSIS — H52223 Regular astigmatism, bilateral: Secondary | ICD-10-CM | POA: Diagnosis not present

## 2017-06-04 DIAGNOSIS — E119 Type 2 diabetes mellitus without complications: Secondary | ICD-10-CM | POA: Diagnosis not present

## 2017-06-04 DIAGNOSIS — D3132 Benign neoplasm of left choroid: Secondary | ICD-10-CM | POA: Diagnosis not present

## 2017-06-04 DIAGNOSIS — H40053 Ocular hypertension, bilateral: Secondary | ICD-10-CM | POA: Diagnosis not present

## 2017-06-04 DIAGNOSIS — H25813 Combined forms of age-related cataract, bilateral: Secondary | ICD-10-CM | POA: Diagnosis not present

## 2017-06-04 DIAGNOSIS — H5203 Hypermetropia, bilateral: Secondary | ICD-10-CM | POA: Diagnosis not present

## 2017-10-08 DIAGNOSIS — E559 Vitamin D deficiency, unspecified: Secondary | ICD-10-CM | POA: Diagnosis not present

## 2017-10-08 DIAGNOSIS — Z79899 Other long term (current) drug therapy: Secondary | ICD-10-CM | POA: Diagnosis not present

## 2017-10-08 DIAGNOSIS — E7800 Pure hypercholesterolemia, unspecified: Secondary | ICD-10-CM | POA: Diagnosis not present

## 2017-10-08 DIAGNOSIS — E1149 Type 2 diabetes mellitus with other diabetic neurological complication: Secondary | ICD-10-CM | POA: Diagnosis not present

## 2017-10-08 DIAGNOSIS — E039 Hypothyroidism, unspecified: Secondary | ICD-10-CM | POA: Diagnosis not present

## 2017-10-19 DIAGNOSIS — Z6821 Body mass index (BMI) 21.0-21.9, adult: Secondary | ICD-10-CM | POA: Diagnosis not present

## 2017-10-19 DIAGNOSIS — Z1331 Encounter for screening for depression: Secondary | ICD-10-CM | POA: Diagnosis not present

## 2017-10-19 DIAGNOSIS — E559 Vitamin D deficiency, unspecified: Secondary | ICD-10-CM | POA: Diagnosis not present

## 2017-10-19 DIAGNOSIS — I2510 Atherosclerotic heart disease of native coronary artery without angina pectoris: Secondary | ICD-10-CM | POA: Diagnosis not present

## 2017-10-19 DIAGNOSIS — Z23 Encounter for immunization: Secondary | ICD-10-CM | POA: Diagnosis not present

## 2017-10-19 DIAGNOSIS — E1149 Type 2 diabetes mellitus with other diabetic neurological complication: Secondary | ICD-10-CM | POA: Diagnosis not present

## 2017-10-19 DIAGNOSIS — E7800 Pure hypercholesterolemia, unspecified: Secondary | ICD-10-CM | POA: Diagnosis not present

## 2017-10-19 DIAGNOSIS — Z9181 History of falling: Secondary | ICD-10-CM | POA: Diagnosis not present

## 2017-10-19 DIAGNOSIS — E039 Hypothyroidism, unspecified: Secondary | ICD-10-CM | POA: Diagnosis not present

## 2018-01-01 DIAGNOSIS — H40011 Open angle with borderline findings, low risk, right eye: Secondary | ICD-10-CM | POA: Diagnosis not present

## 2018-01-01 DIAGNOSIS — H52223 Regular astigmatism, bilateral: Secondary | ICD-10-CM | POA: Diagnosis not present

## 2018-01-01 DIAGNOSIS — H21553 Recession of chamber angle, bilateral: Secondary | ICD-10-CM | POA: Diagnosis not present

## 2018-01-01 DIAGNOSIS — H40012 Open angle with borderline findings, low risk, left eye: Secondary | ICD-10-CM | POA: Diagnosis not present

## 2018-01-01 DIAGNOSIS — H5203 Hypermetropia, bilateral: Secondary | ICD-10-CM | POA: Diagnosis not present

## 2018-01-19 DIAGNOSIS — L570 Actinic keratosis: Secondary | ICD-10-CM | POA: Diagnosis not present

## 2018-01-19 DIAGNOSIS — D225 Melanocytic nevi of trunk: Secondary | ICD-10-CM | POA: Diagnosis not present

## 2018-01-19 DIAGNOSIS — D485 Neoplasm of uncertain behavior of skin: Secondary | ICD-10-CM | POA: Diagnosis not present

## 2018-01-19 DIAGNOSIS — Z85828 Personal history of other malignant neoplasm of skin: Secondary | ICD-10-CM | POA: Diagnosis not present

## 2018-01-19 DIAGNOSIS — C44712 Basal cell carcinoma of skin of right lower limb, including hip: Secondary | ICD-10-CM | POA: Diagnosis not present

## 2018-01-19 DIAGNOSIS — D2261 Melanocytic nevi of right upper limb, including shoulder: Secondary | ICD-10-CM | POA: Diagnosis not present

## 2018-01-19 DIAGNOSIS — C4441 Basal cell carcinoma of skin of scalp and neck: Secondary | ICD-10-CM | POA: Diagnosis not present

## 2018-01-19 DIAGNOSIS — D2262 Melanocytic nevi of left upper limb, including shoulder: Secondary | ICD-10-CM | POA: Diagnosis not present

## 2018-01-19 DIAGNOSIS — L821 Other seborrheic keratosis: Secondary | ICD-10-CM | POA: Diagnosis not present

## 2018-02-10 ENCOUNTER — Encounter: Payer: Self-pay | Admitting: Cardiovascular Disease

## 2018-02-10 ENCOUNTER — Ambulatory Visit (INDEPENDENT_AMBULATORY_CARE_PROVIDER_SITE_OTHER): Payer: Medicare Other | Admitting: Cardiovascular Disease

## 2018-02-10 DIAGNOSIS — I447 Left bundle-branch block, unspecified: Secondary | ICD-10-CM

## 2018-02-10 DIAGNOSIS — E7800 Pure hypercholesterolemia, unspecified: Secondary | ICD-10-CM

## 2018-02-10 NOTE — Patient Instructions (Signed)

## 2018-02-10 NOTE — Assessment & Plan Note (Signed)
Chronic. 

## 2018-02-10 NOTE — Progress Notes (Signed)
02/10/2018 Tristan Maxwell   1934-11-28  [id number]  Primary Physician Cyndi Bender, PA-C Primary Cardiologist: Lorretta Harp MD Lupe Carney, [place]  HPI:  Tristan Maxwell is a 84 y.o.  thin and fit-appearing widowed Caucasian male, father of 21 and grandfather to 1 grandchild, whom I last saw in the office 02/20/17. His wife of 57 years died in 2008-04-11 after sustaining an injury from a fall. He has 1 daughter, who lives in [place], whom he sees once a year. He saw her on the [employer] this year. I catheterized him back in October 2001 revealing noncritical CAD with normal LV function. His prior catheterization before that was in 11-Apr-1993. He denies chest pain or shortness of breath.     Current Meds  Medication Sig  . aspirin 325 MG tablet Take 325 mg by mouth daily.  . B Complex Vitamins (VITAMIN B COMPLEX PO) Take 1 tablet by mouth daily.  . cholecalciferol (VITAMIN D) 1000 UNITS tablet Take 1,000 Units by mouth daily.  . Coenzyme Q10 200 MG capsule Take 200 mg by mouth daily.  . fluticasone (FLONASE) 50 MCG/ACT nasal spray Place 1 spray into both nostrils daily as needed for allergies.   Marland Kitchen levothyroxine (SYNTHROID, LEVOTHROID) 125 MCG tablet Take 125 mcg by mouth daily before breakfast.   . metFORMIN (GLUCOPHAGE) 500 MG tablet Take 500 mg by mouth 3 (three) times daily.  . pravastatin (PRAVACHOL) 10 MG tablet Take 10 mg by mouth every other day.  . Saw Palmetto, Serenoa repens, (SAW PALMETTO PO) Take 1 capsule by mouth daily.  . [DISCONTINUED] Probiotic Product (PROBIOTIC DAILY PO) Take 1 capsule by mouth daily.     No Known Allergies  Social History   Socioeconomic History  . Marital status: Married    Spouse name: Not on file  . Number of children: Not on file  . Years of education: Not on file  . Highest education level: Not on file  Social Needs  . Financial resource strain: Not on file  . Food insecurity - worry: Not on file  . Food insecurity -  inability: Not on file  . Transportation needs - medical: Not on file  . Transportation needs - non-medical: Not on file  Occupational History  . Not on file  Tobacco Use  . Smoking status: Light Tobacco Smoker    Types: Cigars  . Smokeless tobacco: Current User  . Tobacco comment: cigars one or two a month  Substance and Sexual Activity  . Alcohol use: Yes    Alcohol/week: 0.0 oz    Comment: Occas  . Drug use: No  . Sexual activity: Not on file  Other Topics Concern  . Not on file  Social History Narrative  . Not on file     Review of Systems: General: negative for chills, fever, night sweats or weight changes.  Cardiovascular: negative for chest pain, dyspnea on exertion, edema, orthopnea, palpitations, paroxysmal nocturnal dyspnea or shortness of breath Dermatological: negative for rash Respiratory: negative for cough or wheezing Urologic: negative for hematuria Abdominal: negative for nausea, vomiting, diarrhea, bright red blood per rectum, melena, or hematemesis Neurologic: negative for visual changes, syncope, or dizziness All other systems reviewed and are otherwise negative except as noted above.    Blood pressure 121/72, pulse 69, height 5\' 11"  (1.803 m), weight 144 lb 12.8 oz (65.7 kg).  General appearance: alert and no distress Neck: no adenopathy, no carotid bruit, no JVD, supple, symmetrical,  trachea midline and thyroid not enlarged, symmetric, no tenderness/mass/nodules Lungs: clear to auscultation bilaterally Heart: regular rate and rhythm, S1, S2 normal, no murmur, click, rub or gallop Extremities: extremities normal, atraumatic, no cyanosis or edema Pulses: 2+ and symmetric Skin: Skin color, texture, turgor normal. No rashes or lesions Neurologic: Alert and oriented X 3, normal strength and tone. Normal symmetric reflexes. Normal coordination and gait  EKG sinus rhythm at 69 with left bundle branch block. I personally reviewed this EKG  ASSESSMENT AND  PLAN:   Hyperlipidemia History of hyperlipidemia on statin therapy followed by his PCP  Left bundle branch block Chronic      Lorretta Harp MD [facility], [facility] 02/10/2018 4:26 PM

## 2018-02-10 NOTE — Assessment & Plan Note (Signed)
History of hyperlipidemia on statin therapy followed by his PCP 

## 2018-04-19 DIAGNOSIS — E039 Hypothyroidism, unspecified: Secondary | ICD-10-CM | POA: Diagnosis not present

## 2018-04-19 DIAGNOSIS — E7800 Pure hypercholesterolemia, unspecified: Secondary | ICD-10-CM | POA: Diagnosis not present

## 2018-04-19 DIAGNOSIS — Z125 Encounter for screening for malignant neoplasm of prostate: Secondary | ICD-10-CM | POA: Diagnosis not present

## 2018-04-19 DIAGNOSIS — E559 Vitamin D deficiency, unspecified: Secondary | ICD-10-CM | POA: Diagnosis not present

## 2018-04-19 DIAGNOSIS — I2510 Atherosclerotic heart disease of native coronary artery without angina pectoris: Secondary | ICD-10-CM | POA: Diagnosis not present

## 2018-04-19 DIAGNOSIS — E1149 Type 2 diabetes mellitus with other diabetic neurological complication: Secondary | ICD-10-CM | POA: Diagnosis not present

## 2018-04-22 DIAGNOSIS — E1149 Type 2 diabetes mellitus with other diabetic neurological complication: Secondary | ICD-10-CM | POA: Diagnosis not present

## 2018-04-22 DIAGNOSIS — E7800 Pure hypercholesterolemia, unspecified: Secondary | ICD-10-CM | POA: Diagnosis not present

## 2018-04-22 DIAGNOSIS — I2510 Atherosclerotic heart disease of native coronary artery without angina pectoris: Secondary | ICD-10-CM | POA: Diagnosis not present

## 2018-04-22 DIAGNOSIS — E039 Hypothyroidism, unspecified: Secondary | ICD-10-CM | POA: Diagnosis not present

## 2018-04-30 DIAGNOSIS — H9313 Tinnitus, bilateral: Secondary | ICD-10-CM | POA: Diagnosis not present

## 2018-04-30 DIAGNOSIS — Z6820 Body mass index (BMI) 20.0-20.9, adult: Secondary | ICD-10-CM | POA: Diagnosis not present

## 2018-04-30 DIAGNOSIS — R0789 Other chest pain: Secondary | ICD-10-CM | POA: Diagnosis not present

## 2018-04-30 DIAGNOSIS — E1149 Type 2 diabetes mellitus with other diabetic neurological complication: Secondary | ICD-10-CM | POA: Diagnosis not present

## 2018-05-04 DIAGNOSIS — Z6820 Body mass index (BMI) 20.0-20.9, adult: Secondary | ICD-10-CM | POA: Diagnosis not present

## 2018-05-04 DIAGNOSIS — R197 Diarrhea, unspecified: Secondary | ICD-10-CM | POA: Diagnosis not present

## 2018-06-14 DIAGNOSIS — M1711 Unilateral primary osteoarthritis, right knee: Secondary | ICD-10-CM | POA: Diagnosis not present

## 2018-07-01 DIAGNOSIS — H25813 Combined forms of age-related cataract, bilateral: Secondary | ICD-10-CM | POA: Diagnosis not present

## 2018-07-01 DIAGNOSIS — H52223 Regular astigmatism, bilateral: Secondary | ICD-10-CM | POA: Diagnosis not present

## 2018-07-01 DIAGNOSIS — H5203 Hypermetropia, bilateral: Secondary | ICD-10-CM | POA: Diagnosis not present

## 2018-07-01 DIAGNOSIS — H40013 Open angle with borderline findings, low risk, bilateral: Secondary | ICD-10-CM | POA: Diagnosis not present

## 2018-07-01 DIAGNOSIS — E119 Type 2 diabetes mellitus without complications: Secondary | ICD-10-CM | POA: Diagnosis not present

## 2018-07-27 DIAGNOSIS — M1711 Unilateral primary osteoarthritis, right knee: Secondary | ICD-10-CM | POA: Diagnosis not present

## 2018-10-11 DIAGNOSIS — Z6820 Body mass index (BMI) 20.0-20.9, adult: Secondary | ICD-10-CM | POA: Diagnosis not present

## 2018-10-11 DIAGNOSIS — Z23 Encounter for immunization: Secondary | ICD-10-CM | POA: Diagnosis not present

## 2018-10-11 DIAGNOSIS — E1140 Type 2 diabetes mellitus with diabetic neuropathy, unspecified: Secondary | ICD-10-CM | POA: Diagnosis not present

## 2018-10-11 DIAGNOSIS — R197 Diarrhea, unspecified: Secondary | ICD-10-CM | POA: Diagnosis not present

## 2018-10-22 DIAGNOSIS — I2510 Atherosclerotic heart disease of native coronary artery without angina pectoris: Secondary | ICD-10-CM | POA: Diagnosis not present

## 2018-10-22 DIAGNOSIS — E559 Vitamin D deficiency, unspecified: Secondary | ICD-10-CM | POA: Diagnosis not present

## 2018-10-22 DIAGNOSIS — E7800 Pure hypercholesterolemia, unspecified: Secondary | ICD-10-CM | POA: Diagnosis not present

## 2018-10-22 DIAGNOSIS — E039 Hypothyroidism, unspecified: Secondary | ICD-10-CM | POA: Diagnosis not present

## 2018-10-22 DIAGNOSIS — E1149 Type 2 diabetes mellitus with other diabetic neurological complication: Secondary | ICD-10-CM | POA: Diagnosis not present

## 2018-10-26 DIAGNOSIS — I2510 Atherosclerotic heart disease of native coronary artery without angina pectoris: Secondary | ICD-10-CM | POA: Diagnosis not present

## 2018-10-26 DIAGNOSIS — E7800 Pure hypercholesterolemia, unspecified: Secondary | ICD-10-CM | POA: Diagnosis not present

## 2018-10-26 DIAGNOSIS — Z1331 Encounter for screening for depression: Secondary | ICD-10-CM | POA: Diagnosis not present

## 2018-10-26 DIAGNOSIS — E039 Hypothyroidism, unspecified: Secondary | ICD-10-CM | POA: Diagnosis not present

## 2018-10-26 DIAGNOSIS — Z9181 History of falling: Secondary | ICD-10-CM | POA: Diagnosis not present

## 2018-10-26 DIAGNOSIS — E1149 Type 2 diabetes mellitus with other diabetic neurological complication: Secondary | ICD-10-CM | POA: Diagnosis not present

## 2018-11-11 ENCOUNTER — Other Ambulatory Visit: Payer: Self-pay

## 2018-12-30 DIAGNOSIS — H25813 Combined forms of age-related cataract, bilateral: Secondary | ICD-10-CM | POA: Diagnosis not present

## 2018-12-30 DIAGNOSIS — H524 Presbyopia: Secondary | ICD-10-CM | POA: Diagnosis not present

## 2018-12-30 DIAGNOSIS — H5203 Hypermetropia, bilateral: Secondary | ICD-10-CM | POA: Diagnosis not present

## 2018-12-30 DIAGNOSIS — H52223 Regular astigmatism, bilateral: Secondary | ICD-10-CM | POA: Diagnosis not present

## 2018-12-30 DIAGNOSIS — H40013 Open angle with borderline findings, low risk, bilateral: Secondary | ICD-10-CM | POA: Diagnosis not present

## 2018-12-30 DIAGNOSIS — H40053 Ocular hypertension, bilateral: Secondary | ICD-10-CM | POA: Diagnosis not present

## 2019-01-08 DIAGNOSIS — F17220 Nicotine dependence, chewing tobacco, uncomplicated: Secondary | ICD-10-CM | POA: Diagnosis not present

## 2019-01-08 DIAGNOSIS — E785 Hyperlipidemia, unspecified: Secondary | ICD-10-CM | POA: Diagnosis not present

## 2019-01-08 DIAGNOSIS — E039 Hypothyroidism, unspecified: Secondary | ICD-10-CM | POA: Diagnosis not present

## 2019-01-08 DIAGNOSIS — M7121 Synovial cyst of popliteal space [Baker], right knee: Secondary | ICD-10-CM | POA: Diagnosis not present

## 2019-01-08 DIAGNOSIS — E119 Type 2 diabetes mellitus without complications: Secondary | ICD-10-CM | POA: Diagnosis not present

## 2019-01-18 DIAGNOSIS — M7121 Synovial cyst of popliteal space [Baker], right knee: Secondary | ICD-10-CM | POA: Diagnosis not present

## 2019-01-18 DIAGNOSIS — M1711 Unilateral primary osteoarthritis, right knee: Secondary | ICD-10-CM | POA: Diagnosis not present

## 2019-01-25 DIAGNOSIS — C4441 Basal cell carcinoma of skin of scalp and neck: Secondary | ICD-10-CM | POA: Diagnosis not present

## 2019-01-25 DIAGNOSIS — Z85828 Personal history of other malignant neoplasm of skin: Secondary | ICD-10-CM | POA: Diagnosis not present

## 2019-01-25 DIAGNOSIS — D485 Neoplasm of uncertain behavior of skin: Secondary | ICD-10-CM | POA: Diagnosis not present

## 2019-01-25 DIAGNOSIS — L570 Actinic keratosis: Secondary | ICD-10-CM | POA: Diagnosis not present

## 2019-01-25 DIAGNOSIS — L821 Other seborrheic keratosis: Secondary | ICD-10-CM | POA: Diagnosis not present

## 2019-01-26 DIAGNOSIS — E039 Hypothyroidism, unspecified: Secondary | ICD-10-CM | POA: Diagnosis not present

## 2019-01-26 DIAGNOSIS — I2510 Atherosclerotic heart disease of native coronary artery without angina pectoris: Secondary | ICD-10-CM | POA: Diagnosis not present

## 2019-01-26 DIAGNOSIS — E1149 Type 2 diabetes mellitus with other diabetic neurological complication: Secondary | ICD-10-CM | POA: Diagnosis not present

## 2019-01-26 DIAGNOSIS — E7800 Pure hypercholesterolemia, unspecified: Secondary | ICD-10-CM | POA: Diagnosis not present

## 2019-01-28 DIAGNOSIS — I2510 Atherosclerotic heart disease of native coronary artery without angina pectoris: Secondary | ICD-10-CM | POA: Diagnosis not present

## 2019-01-28 DIAGNOSIS — Z6821 Body mass index (BMI) 21.0-21.9, adult: Secondary | ICD-10-CM | POA: Diagnosis not present

## 2019-01-28 DIAGNOSIS — E7800 Pure hypercholesterolemia, unspecified: Secondary | ICD-10-CM | POA: Diagnosis not present

## 2019-01-28 DIAGNOSIS — E1149 Type 2 diabetes mellitus with other diabetic neurological complication: Secondary | ICD-10-CM | POA: Diagnosis not present

## 2019-02-15 DIAGNOSIS — H2512 Age-related nuclear cataract, left eye: Secondary | ICD-10-CM | POA: Diagnosis not present

## 2019-02-15 DIAGNOSIS — H18413 Arcus senilis, bilateral: Secondary | ICD-10-CM | POA: Diagnosis not present

## 2019-02-15 DIAGNOSIS — H25043 Posterior subcapsular polar age-related cataract, bilateral: Secondary | ICD-10-CM | POA: Diagnosis not present

## 2019-02-15 DIAGNOSIS — H2513 Age-related nuclear cataract, bilateral: Secondary | ICD-10-CM | POA: Diagnosis not present

## 2019-02-15 DIAGNOSIS — H02831 Dermatochalasis of right upper eyelid: Secondary | ICD-10-CM | POA: Diagnosis not present

## 2019-02-15 DIAGNOSIS — H25013 Cortical age-related cataract, bilateral: Secondary | ICD-10-CM | POA: Diagnosis not present

## 2019-04-26 DIAGNOSIS — E559 Vitamin D deficiency, unspecified: Secondary | ICD-10-CM | POA: Diagnosis not present

## 2019-04-26 DIAGNOSIS — I2510 Atherosclerotic heart disease of native coronary artery without angina pectoris: Secondary | ICD-10-CM | POA: Diagnosis not present

## 2019-04-26 DIAGNOSIS — E7800 Pure hypercholesterolemia, unspecified: Secondary | ICD-10-CM | POA: Diagnosis not present

## 2019-04-26 DIAGNOSIS — Z125 Encounter for screening for malignant neoplasm of prostate: Secondary | ICD-10-CM | POA: Diagnosis not present

## 2019-04-26 DIAGNOSIS — E1149 Type 2 diabetes mellitus with other diabetic neurological complication: Secondary | ICD-10-CM | POA: Diagnosis not present

## 2019-04-26 DIAGNOSIS — E039 Hypothyroidism, unspecified: Secondary | ICD-10-CM | POA: Diagnosis not present

## 2019-04-28 DIAGNOSIS — E1149 Type 2 diabetes mellitus with other diabetic neurological complication: Secondary | ICD-10-CM | POA: Diagnosis not present

## 2019-04-28 DIAGNOSIS — G2581 Restless legs syndrome: Secondary | ICD-10-CM | POA: Diagnosis not present

## 2019-04-28 DIAGNOSIS — I2510 Atherosclerotic heart disease of native coronary artery without angina pectoris: Secondary | ICD-10-CM | POA: Diagnosis not present

## 2019-04-28 DIAGNOSIS — E7800 Pure hypercholesterolemia, unspecified: Secondary | ICD-10-CM | POA: Diagnosis not present

## 2019-07-22 DIAGNOSIS — H2512 Age-related nuclear cataract, left eye: Secondary | ICD-10-CM | POA: Diagnosis not present

## 2019-07-22 DIAGNOSIS — H40052 Ocular hypertension, left eye: Secondary | ICD-10-CM | POA: Diagnosis not present

## 2019-07-22 DIAGNOSIS — H2511 Age-related nuclear cataract, right eye: Secondary | ICD-10-CM | POA: Diagnosis not present

## 2019-07-29 ENCOUNTER — Other Ambulatory Visit: Payer: Self-pay

## 2019-07-29 ENCOUNTER — Ambulatory Visit (INDEPENDENT_AMBULATORY_CARE_PROVIDER_SITE_OTHER): Payer: Medicare Other | Admitting: Cardiology

## 2019-07-29 ENCOUNTER — Encounter: Payer: Self-pay | Admitting: Cardiology

## 2019-07-29 VITALS — BP 135/75 | HR 72 | Temp 97.4°F | Ht 70.0 in | Wt 146.0 lb

## 2019-07-29 DIAGNOSIS — E119 Type 2 diabetes mellitus without complications: Secondary | ICD-10-CM | POA: Diagnosis not present

## 2019-07-29 DIAGNOSIS — I447 Left bundle-branch block, unspecified: Secondary | ICD-10-CM | POA: Diagnosis not present

## 2019-07-29 DIAGNOSIS — E785 Hyperlipidemia, unspecified: Secondary | ICD-10-CM

## 2019-07-29 DIAGNOSIS — I2510 Atherosclerotic heart disease of native coronary artery without angina pectoris: Secondary | ICD-10-CM | POA: Diagnosis not present

## 2019-07-29 MED ORDER — ASPIRIN EC 81 MG PO TBEC: 81.0000 mg | DELAYED_RELEASE_TABLET | Freq: Every day | ORAL | 3 refills | Status: AC

## 2019-07-29 NOTE — Assessment & Plan Note (Signed)
Hgb A1c was 6.3 in June

## 2019-07-29 NOTE — Assessment & Plan Note (Signed)
Chronic Left bundle branch block

## 2019-07-29 NOTE — Assessment & Plan Note (Signed)
Non obstructive CAD 1994, 2002, low risk Myoview 2014

## 2019-07-29 NOTE — Progress Notes (Addendum)
Cardiology Office Note:    Date:  07/29/2019   ID:  Mindi Slicker Brassell, DOB 1934-08-17, MRN [id number]  PCP:  Cyndi Bender, PA-C  Cardiologist:  No primary care provider on file.  Electrophysiologist:  None   Referring MD: Cyndi Bender, PA-C   No chief complaint on file. Sent because of abnormal EKG at time of cataract surgery.  History of Present Illness:    Tristan Maxwell is a 85 y.o. male with a hx of chronic left bundle branch block, non-insulin-dependent diabetes, treated dyslipidemia, and noncritical coronary disease by prior catheterization.  He recently had cataract surgery and his EKG was noted to be abnormal and he was referred to [place].  The EKG shows sinus rhythm with left bundle branch block and PACs.  His overall rate is 57.  The patient had prior heart catheterization in 1994 and again in 2001 showing minor nonobstructive coronary disease.  He had a low risk Myoview in February 2014.  He had been going to the gym daily using an exercise bike and treadmill up until the [employer] pandemic shutdown the gyms.  Recently for exercise he started walking.  He also does squats at night holding onto a chair, he tells me this helps with his restless leg.  He denies any chest pain or unusual dyspnea.  He is not had syncope or palpitations.  Past Medical History:  Diagnosis Date  . Hyperlipidemia   . Hypothyroidism   . Left bundle branch block    a. new 02/2015.  . Midsternal chest pain    a. 1994 nonobs cath;  b. 09/2000 cath: LM nl, LAD 30-40d, D1 40-50ost, LCX nl, RCA dominant, nl, EF 60%;  c. 01/2013 Neg MV.  . Type 2 diabetes mellitus ([place])     Past Surgical History:  Procedure Laterality Date  . CARDIAC CATHETERIZATION  09/2000   NON-CRITICAL CAD  . CAROTID DOPPLER  01/26/13   BILATERAL ICA;NORMAL PATENCY. DISTAL ICA ARE TORTUOUS. THE DISTAL LICA MAKES A COMPLETE 360 DEGREE LOOP.  Marland Kitchen CHOLECYSTECTOMY    . COLONOSCOPY WITH PROPOFOL N/A 05/08/2015   Procedure: COLONOSCOPY WITH  PROPOFOL;  Surgeon: Garlan Fair, MD;  Location: [facility];  Service: Endoscopy;  Laterality: N/A;  . HERNIA REPAIR     right inguinal  . MYOCARDIAL PERFUSION STUDY  01/26/13   INFERIOR-INFEROSEPTAL PERFUSION DEFECT CONSISTANT WITH DIAPHRAGMATIC ATTENUATION.    Current Medications: Current Meds  Medication Sig  . B Complex Vitamins (VITAMIN B COMPLEX PO) Take 1 tablet by mouth daily.  . cholecalciferol (VITAMIN D) 1000 UNITS tablet Take 1,000 Units by mouth daily.  . Coenzyme Q10 200 MG capsule Take 200 mg by mouth daily.  . fluticasone (FLONASE) 50 MCG/ACT nasal spray Place 1 spray into both nostrils daily as needed for allergies.   Marland Kitchen levothyroxine (SYNTHROID, LEVOTHROID) 125 MCG tablet Take 125 mcg by mouth daily before breakfast.   . linagliptin (TRADJENTA) 5 MG TABS tablet Take 5 mg by mouth daily.  . metFORMIN (GLUCOPHAGE) 500 MG tablet Take 500 mg by mouth daily with breakfast.   . pravastatin (PRAVACHOL) 10 MG tablet Take 10 mg by mouth every other day.  . Saw Palmetto, Serenoa repens, (SAW PALMETTO PO) Take 1 capsule by mouth daily.  . [DISCONTINUED] aspirin 325 MG tablet Take 325 mg by mouth daily.     Allergies:   Patient has no known allergies.   Social History   Socioeconomic History  . Marital status: Married    Spouse name:  Not on file  . Number of children: Not on file  . Years of education: Not on file  . Highest education level: Not on file  Occupational History  . Not on file  Social Needs  . Financial resource strain: Not on file  . Food insecurity    Worry: Not on file    Inability: Not on file  . Transportation needs    Medical: Not on file    Non-medical: Not on file  Tobacco Use  . Smoking status: Light Tobacco Smoker    Types: Cigars  . Smokeless tobacco: Current User  . Tobacco comment: cigars one or two a month  Substance and Sexual Activity  . Alcohol use: Yes    Alcohol/week: 0.0 standard drinks    Comment: Occas  . Drug use: No  .  Sexual activity: Not on file  Lifestyle  . Physical activity    Days per week: Not on file    Minutes per session: Not on file  . Stress: Not on file  Relationships  . Social Herbalist on phone: Not on file    Gets together: Not on file    Attends religious service: Not on file    Active member of club or organization: Not on file    Attends meetings of clubs or organizations: Not on file    Relationship status: Not on file  Other Topics Concern  . Not on file  Social History Narrative  . Not on file     Family History: The patient's family history is not on file.  ROS:   Please see the history of present illness.     All other systems reviewed and are negative.  EKGs/Labs/Other Studies Reviewed:    The following studies were reviewed today: Cath 2001 Myoview 2014  EKG:  EKG is not ordered today.  The ekg ordered 07/22/2019  demonstrates NSR, SB-57, PAC, LBBB  Recent Labs: No results found for requested labs within last 8760 hours.  Recent Lipid Panel    Component Value Date/Time   CHOL 179 07/19/2015 0216   TRIG 186 (H) 07/19/2015 0216   HDL 66 07/19/2015 0216   CHOLHDL 2.7 07/19/2015 0216   VLDL 37 07/19/2015 0216   LDLCALC 76 07/19/2015 0216    Physical Exam:    VS:  BP 135/75   Pulse 72   Temp (!) 97.4 F (36.3 C)   Ht 5\' 10"  (1.778 m)   Wt 146 lb (66.2 kg)   SpO2 97%   BMI 20.95 kg/m     Wt Readings from Last 3 Encounters:  07/29/19 146 lb (66.2 kg)  02/10/18 144 lb 12.8 oz (65.7 kg)  01/28/17 150 lb (68 kg)     GEN:  Well nourished, well developed in no acute distress HEENT: Normal NECK: No JVD; No carotid bruits LYMPHATICS: No lymphadenopathy CARDIAC: RRR, no murmurs, rubs, gallops RESPIRATORY:  Clear to auscultation without rales, wheezing or rhonchi  ABDOMEN: Soft, non-tender, non-distended MUSCULOSKELETAL:  No edema; No deformity  SKIN: Warm and dry NEUROLOGIC:  Alert and oriented x 3 PSYCHIATRIC:  Normal affect    ASSESSMENT:    Left bundle branch block Chronic Left bundle branch block  CAD (coronary artery disease) Non obstructive CAD 1994, 2002, low risk Myoview 2014  Non-insulin treated type 2 diabetes mellitus (HCC) Hgb A1c was 6.3 in June   Hyperlipidemia On low dose statin Rx -followed by PCP  PLAN:    I did suggest  he decrease his ASA to 81 mg daily, otherwise no change in Rx.  His PCP follows his lipids.  We will arrange f/u with Dr Gwenlyn Found in a year.    Medication Adjustments/Labs and Tests Ordered: Current medicines are reviewed at length with the patient today.  Concerns regarding medicines are outlined above.  No orders of the defined types were placed in this encounter.  Meds ordered this encounter  Medications  . aspirin EC 81 MG tablet    Sig: Take 1 tablet (81 mg total) by mouth daily.    Dispense:  90 tablet    Refill:  3    Patient Instructions  Medication Instructions:  DECREASE Aspirin to 81mg  Once a day  If you need a refill on your cardiac medications before your next appointment, please call your pharmacy.   Lab work: None  If you have labs (blood work) drawn today and your tests are completely normal, you will receive your results only by: Marland Kitchen MyChart Message (if you have MyChart) OR . A paper copy in the mail If you have any lab test that is abnormal or we need to change your treatment, we will call you to review the results.  Testing/Procedures: None   Follow-Up: At [facility], you and your health needs are our priority.  As part of our continuing mission to provide you with exceptional heart care, we have created designated Provider Care Teams.  These Care Teams include your primary Cardiologist (physician) and Advanced Practice Providers (APPs -  Physician Assistants and Nurse Practitioners) who all work together to provide you with the care you need, when you need it. You will need a follow up appointment in 12 months.  Please call our office 2  months in advance to schedule this appointment.  You may see Dr Quay Burow or one of the following Advanced Practice Providers on your designated Care Team:   Kerin Ransom, PA-C Roby Lofts, [place] . Sande Rives, PA-C  Any Other Special Instructions Will Be Listed Below (If Applicable).      Angelena Form, PA-C  07/29/2019 11:16 AM    [facility]

## 2019-07-29 NOTE — Patient Instructions (Signed)
Medication Instructions:  DECREASE Aspirin to 81mg  Once a day  If you need a refill on your cardiac medications before your next appointment, please call your pharmacy.   Lab work: None  If you have labs (blood work) drawn today and your tests are completely normal, you will receive your results only by: Marland Kitchen MyChart Message (if you have MyChart) OR . A paper copy in the mail If you have any lab test that is abnormal or we need to change your treatment, we will call you to review the results.  Testing/Procedures: None   Follow-Up: At [facility], you and your health needs are our priority.  As part of our continuing mission to provide you with exceptional heart care, we have created designated Provider Care Teams.  These Care Teams include your primary Cardiologist (physician) and Advanced Practice Providers (APPs -  Physician Assistants and Nurse Practitioners) who all work together to provide you with the care you need, when you need it. You will need a follow up appointment in 12 months.  Please call our office 2 months in advance to schedule this appointment.  You may see Dr Quay Burow or one of the following Advanced Practice Providers on your designated Care Team:   Kerin Ransom, PA-C Roby Lofts, [place] . Sande Rives, PA-C  Any Other Special Instructions Will Be Listed Below (If Applicable).

## 2019-07-29 NOTE — Assessment & Plan Note (Signed)
On low dose statin Rx- followed by PCP 

## 2019-08-08 DIAGNOSIS — H25811 Combined forms of age-related cataract, right eye: Secondary | ICD-10-CM | POA: Diagnosis not present

## 2019-08-08 DIAGNOSIS — Z961 Presence of intraocular lens: Secondary | ICD-10-CM | POA: Diagnosis not present

## 2019-08-08 DIAGNOSIS — H2511 Age-related nuclear cataract, right eye: Secondary | ICD-10-CM | POA: Diagnosis not present

## 2019-09-26 DIAGNOSIS — Z23 Encounter for immunization: Secondary | ICD-10-CM | POA: Diagnosis not present

## 2019-10-11 DIAGNOSIS — H43813 Vitreous degeneration, bilateral: Secondary | ICD-10-CM | POA: Diagnosis not present

## 2019-11-01 DIAGNOSIS — E1149 Type 2 diabetes mellitus with other diabetic neurological complication: Secondary | ICD-10-CM | POA: Diagnosis not present

## 2019-11-01 DIAGNOSIS — D485 Neoplasm of uncertain behavior of skin: Secondary | ICD-10-CM | POA: Diagnosis not present

## 2019-11-01 DIAGNOSIS — C4441 Basal cell carcinoma of skin of scalp and neck: Secondary | ICD-10-CM | POA: Diagnosis not present

## 2019-11-01 DIAGNOSIS — L821 Other seborrheic keratosis: Secondary | ICD-10-CM | POA: Diagnosis not present

## 2019-11-01 DIAGNOSIS — H61002 Unspecified perichondritis of left external ear: Secondary | ICD-10-CM | POA: Diagnosis not present

## 2019-11-01 DIAGNOSIS — E039 Hypothyroidism, unspecified: Secondary | ICD-10-CM | POA: Diagnosis not present

## 2019-11-01 DIAGNOSIS — E7800 Pure hypercholesterolemia, unspecified: Secondary | ICD-10-CM | POA: Diagnosis not present

## 2019-11-01 DIAGNOSIS — I2510 Atherosclerotic heart disease of native coronary artery without angina pectoris: Secondary | ICD-10-CM | POA: Diagnosis not present

## 2019-11-01 DIAGNOSIS — C4442 Squamous cell carcinoma of skin of scalp and neck: Secondary | ICD-10-CM | POA: Diagnosis not present

## 2019-11-01 DIAGNOSIS — E559 Vitamin D deficiency, unspecified: Secondary | ICD-10-CM | POA: Diagnosis not present

## 2019-11-01 DIAGNOSIS — Z85828 Personal history of other malignant neoplasm of skin: Secondary | ICD-10-CM | POA: Diagnosis not present

## 2019-11-03 DIAGNOSIS — E1149 Type 2 diabetes mellitus with other diabetic neurological complication: Secondary | ICD-10-CM | POA: Diagnosis not present

## 2019-11-03 DIAGNOSIS — Z139 Encounter for screening, unspecified: Secondary | ICD-10-CM | POA: Diagnosis not present

## 2019-11-03 DIAGNOSIS — E7800 Pure hypercholesterolemia, unspecified: Secondary | ICD-10-CM | POA: Diagnosis not present

## 2019-11-03 DIAGNOSIS — Z1331 Encounter for screening for depression: Secondary | ICD-10-CM | POA: Diagnosis not present

## 2019-11-03 DIAGNOSIS — E039 Hypothyroidism, unspecified: Secondary | ICD-10-CM | POA: Diagnosis not present

## 2019-11-03 DIAGNOSIS — Z9181 History of falling: Secondary | ICD-10-CM | POA: Diagnosis not present

## 2019-11-03 DIAGNOSIS — I2510 Atherosclerotic heart disease of native coronary artery without angina pectoris: Secondary | ICD-10-CM | POA: Diagnosis not present

## 2019-11-09 DIAGNOSIS — H52223 Regular astigmatism, bilateral: Secondary | ICD-10-CM | POA: Diagnosis not present

## 2019-11-09 DIAGNOSIS — H43812 Vitreous degeneration, left eye: Secondary | ICD-10-CM | POA: Diagnosis not present

## 2019-11-09 DIAGNOSIS — H5202 Hypermetropia, left eye: Secondary | ICD-10-CM | POA: Diagnosis not present

## 2019-11-09 DIAGNOSIS — H524 Presbyopia: Secondary | ICD-10-CM | POA: Diagnosis not present

## 2019-11-24 DIAGNOSIS — E785 Hyperlipidemia, unspecified: Secondary | ICD-10-CM | POA: Diagnosis not present

## 2019-11-24 DIAGNOSIS — Z9181 History of falling: Secondary | ICD-10-CM | POA: Diagnosis not present

## 2019-11-24 DIAGNOSIS — Z139 Encounter for screening, unspecified: Secondary | ICD-10-CM | POA: Diagnosis not present

## 2019-11-24 DIAGNOSIS — Z1331 Encounter for screening for depression: Secondary | ICD-10-CM | POA: Diagnosis not present

## 2019-11-24 DIAGNOSIS — Z0000 Encounter for general adult medical examination without abnormal findings: Secondary | ICD-10-CM | POA: Diagnosis not present

## 2020-01-30 DIAGNOSIS — L821 Other seborrheic keratosis: Secondary | ICD-10-CM | POA: Diagnosis not present

## 2020-01-30 DIAGNOSIS — D225 Melanocytic nevi of trunk: Secondary | ICD-10-CM | POA: Diagnosis not present

## 2020-01-30 DIAGNOSIS — C4441 Basal cell carcinoma of skin of scalp and neck: Secondary | ICD-10-CM | POA: Diagnosis not present

## 2020-01-30 DIAGNOSIS — Z85828 Personal history of other malignant neoplasm of skin: Secondary | ICD-10-CM | POA: Diagnosis not present

## 2020-01-30 DIAGNOSIS — D2262 Melanocytic nevi of left upper limb, including shoulder: Secondary | ICD-10-CM | POA: Diagnosis not present

## 2020-01-30 DIAGNOSIS — C44219 Basal cell carcinoma of skin of left ear and external auricular canal: Secondary | ICD-10-CM | POA: Diagnosis not present

## 2020-01-30 DIAGNOSIS — D485 Neoplasm of uncertain behavior of skin: Secondary | ICD-10-CM | POA: Diagnosis not present

## 2020-01-30 DIAGNOSIS — L570 Actinic keratosis: Secondary | ICD-10-CM | POA: Diagnosis not present

## 2020-02-07 DIAGNOSIS — E1149 Type 2 diabetes mellitus with other diabetic neurological complication: Secondary | ICD-10-CM | POA: Diagnosis not present

## 2020-02-07 DIAGNOSIS — Z6821 Body mass index (BMI) 21.0-21.9, adult: Secondary | ICD-10-CM | POA: Diagnosis not present

## 2020-02-07 DIAGNOSIS — E039 Hypothyroidism, unspecified: Secondary | ICD-10-CM | POA: Diagnosis not present

## 2020-02-07 DIAGNOSIS — K5909 Other constipation: Secondary | ICD-10-CM | POA: Diagnosis not present

## 2020-02-16 DIAGNOSIS — Z85828 Personal history of other malignant neoplasm of skin: Secondary | ICD-10-CM | POA: Diagnosis not present

## 2020-02-16 DIAGNOSIS — C44229 Squamous cell carcinoma of skin of left ear and external auricular canal: Secondary | ICD-10-CM | POA: Diagnosis not present

## 2020-02-16 DIAGNOSIS — C44219 Basal cell carcinoma of skin of left ear and external auricular canal: Secondary | ICD-10-CM | POA: Diagnosis not present

## 2020-05-02 DIAGNOSIS — E559 Vitamin D deficiency, unspecified: Secondary | ICD-10-CM | POA: Diagnosis not present

## 2020-05-02 DIAGNOSIS — E039 Hypothyroidism, unspecified: Secondary | ICD-10-CM | POA: Diagnosis not present

## 2020-05-02 DIAGNOSIS — E1149 Type 2 diabetes mellitus with other diabetic neurological complication: Secondary | ICD-10-CM | POA: Diagnosis not present

## 2020-05-02 DIAGNOSIS — I2510 Atherosclerotic heart disease of native coronary artery without angina pectoris: Secondary | ICD-10-CM | POA: Diagnosis not present

## 2020-05-02 DIAGNOSIS — Z125 Encounter for screening for malignant neoplasm of prostate: Secondary | ICD-10-CM | POA: Diagnosis not present

## 2020-05-02 DIAGNOSIS — E7800 Pure hypercholesterolemia, unspecified: Secondary | ICD-10-CM | POA: Diagnosis not present

## 2020-05-04 DIAGNOSIS — E7800 Pure hypercholesterolemia, unspecified: Secondary | ICD-10-CM | POA: Diagnosis not present

## 2020-05-04 DIAGNOSIS — E1149 Type 2 diabetes mellitus with other diabetic neurological complication: Secondary | ICD-10-CM | POA: Diagnosis not present

## 2020-05-04 DIAGNOSIS — I2510 Atherosclerotic heart disease of native coronary artery without angina pectoris: Secondary | ICD-10-CM | POA: Diagnosis not present

## 2020-05-04 DIAGNOSIS — E039 Hypothyroidism, unspecified: Secondary | ICD-10-CM | POA: Diagnosis not present

## 2020-05-04 DIAGNOSIS — R351 Nocturia: Secondary | ICD-10-CM | POA: Diagnosis not present

## 2020-07-12 DIAGNOSIS — G2581 Restless legs syndrome: Secondary | ICD-10-CM | POA: Diagnosis not present

## 2020-07-12 DIAGNOSIS — M25512 Pain in left shoulder: Secondary | ICD-10-CM | POA: Diagnosis not present

## 2020-07-12 DIAGNOSIS — Z6820 Body mass index (BMI) 20.0-20.9, adult: Secondary | ICD-10-CM | POA: Diagnosis not present

## 2020-07-12 DIAGNOSIS — I2510 Atherosclerotic heart disease of native coronary artery without angina pectoris: Secondary | ICD-10-CM | POA: Diagnosis not present

## 2020-09-14 DIAGNOSIS — Z23 Encounter for immunization: Secondary | ICD-10-CM | POA: Diagnosis not present

## 2020-11-09 DIAGNOSIS — E559 Vitamin D deficiency, unspecified: Secondary | ICD-10-CM | POA: Diagnosis not present

## 2020-11-09 DIAGNOSIS — E7800 Pure hypercholesterolemia, unspecified: Secondary | ICD-10-CM | POA: Diagnosis not present

## 2020-11-09 DIAGNOSIS — E039 Hypothyroidism, unspecified: Secondary | ICD-10-CM | POA: Diagnosis not present

## 2020-11-09 DIAGNOSIS — E1149 Type 2 diabetes mellitus with other diabetic neurological complication: Secondary | ICD-10-CM | POA: Diagnosis not present

## 2020-11-09 DIAGNOSIS — I2510 Atherosclerotic heart disease of native coronary artery without angina pectoris: Secondary | ICD-10-CM | POA: Diagnosis not present

## 2020-11-13 DIAGNOSIS — E039 Hypothyroidism, unspecified: Secondary | ICD-10-CM | POA: Diagnosis not present

## 2020-11-13 DIAGNOSIS — I2510 Atherosclerotic heart disease of native coronary artery without angina pectoris: Secondary | ICD-10-CM | POA: Diagnosis not present

## 2020-11-13 DIAGNOSIS — E7800 Pure hypercholesterolemia, unspecified: Secondary | ICD-10-CM | POA: Diagnosis not present

## 2020-11-13 DIAGNOSIS — G2581 Restless legs syndrome: Secondary | ICD-10-CM | POA: Diagnosis not present

## 2020-11-13 DIAGNOSIS — E1149 Type 2 diabetes mellitus with other diabetic neurological complication: Secondary | ICD-10-CM | POA: Diagnosis not present

## 2020-11-13 DIAGNOSIS — Z6820 Body mass index (BMI) 20.0-20.9, adult: Secondary | ICD-10-CM | POA: Diagnosis not present

## 2020-11-26 DIAGNOSIS — Z0000 Encounter for general adult medical examination without abnormal findings: Secondary | ICD-10-CM | POA: Diagnosis not present

## 2020-11-26 DIAGNOSIS — E785 Hyperlipidemia, unspecified: Secondary | ICD-10-CM | POA: Diagnosis not present

## 2020-11-26 DIAGNOSIS — Z9181 History of falling: Secondary | ICD-10-CM | POA: Diagnosis not present

## 2020-11-26 DIAGNOSIS — Z1331 Encounter for screening for depression: Secondary | ICD-10-CM | POA: Diagnosis not present

## 2020-11-29 DIAGNOSIS — H524 Presbyopia: Secondary | ICD-10-CM | POA: Diagnosis not present

## 2020-11-29 DIAGNOSIS — H35033 Hypertensive retinopathy, bilateral: Secondary | ICD-10-CM | POA: Diagnosis not present

## 2020-11-29 DIAGNOSIS — H52223 Regular astigmatism, bilateral: Secondary | ICD-10-CM | POA: Diagnosis not present

## 2020-11-29 DIAGNOSIS — I10 Essential (primary) hypertension: Secondary | ICD-10-CM | POA: Diagnosis not present

## 2020-11-29 DIAGNOSIS — E119 Type 2 diabetes mellitus without complications: Secondary | ICD-10-CM | POA: Diagnosis not present

## 2020-11-29 DIAGNOSIS — H5203 Hypermetropia, bilateral: Secondary | ICD-10-CM | POA: Diagnosis not present

## 2021-01-04 DIAGNOSIS — K5900 Constipation, unspecified: Secondary | ICD-10-CM | POA: Diagnosis not present

## 2021-01-29 DIAGNOSIS — Z85828 Personal history of other malignant neoplasm of skin: Secondary | ICD-10-CM | POA: Diagnosis not present

## 2021-01-29 DIAGNOSIS — D2262 Melanocytic nevi of left upper limb, including shoulder: Secondary | ICD-10-CM | POA: Diagnosis not present

## 2021-01-29 DIAGNOSIS — D485 Neoplasm of uncertain behavior of skin: Secondary | ICD-10-CM | POA: Diagnosis not present

## 2021-01-29 DIAGNOSIS — C4441 Basal cell carcinoma of skin of scalp and neck: Secondary | ICD-10-CM | POA: Diagnosis not present

## 2021-01-29 DIAGNOSIS — C44212 Basal cell carcinoma of skin of right ear and external auricular canal: Secondary | ICD-10-CM | POA: Diagnosis not present

## 2021-01-29 DIAGNOSIS — D2261 Melanocytic nevi of right upper limb, including shoulder: Secondary | ICD-10-CM | POA: Diagnosis not present

## 2021-01-29 DIAGNOSIS — L821 Other seborrheic keratosis: Secondary | ICD-10-CM | POA: Diagnosis not present

## 2021-01-29 DIAGNOSIS — D224 Melanocytic nevi of scalp and neck: Secondary | ICD-10-CM | POA: Diagnosis not present

## 2021-02-05 DIAGNOSIS — K5900 Constipation, unspecified: Secondary | ICD-10-CM | POA: Diagnosis not present

## 2021-02-12 DIAGNOSIS — E039 Hypothyroidism, unspecified: Secondary | ICD-10-CM | POA: Diagnosis not present

## 2021-02-12 DIAGNOSIS — E7800 Pure hypercholesterolemia, unspecified: Secondary | ICD-10-CM | POA: Diagnosis not present

## 2021-02-12 DIAGNOSIS — I2510 Atherosclerotic heart disease of native coronary artery without angina pectoris: Secondary | ICD-10-CM | POA: Diagnosis not present

## 2021-02-12 DIAGNOSIS — E1149 Type 2 diabetes mellitus with other diabetic neurological complication: Secondary | ICD-10-CM | POA: Diagnosis not present

## 2021-02-13 DIAGNOSIS — C44212 Basal cell carcinoma of skin of right ear and external auricular canal: Secondary | ICD-10-CM | POA: Diagnosis not present

## 2021-02-13 DIAGNOSIS — Z85828 Personal history of other malignant neoplasm of skin: Secondary | ICD-10-CM | POA: Diagnosis not present

## 2021-02-14 DIAGNOSIS — E1122 Type 2 diabetes mellitus with diabetic chronic kidney disease: Secondary | ICD-10-CM | POA: Diagnosis not present

## 2021-02-14 DIAGNOSIS — E039 Hypothyroidism, unspecified: Secondary | ICD-10-CM | POA: Diagnosis not present

## 2021-02-14 DIAGNOSIS — N183 Chronic kidney disease, stage 3 unspecified: Secondary | ICD-10-CM | POA: Diagnosis not present

## 2021-02-14 DIAGNOSIS — Z6820 Body mass index (BMI) 20.0-20.9, adult: Secondary | ICD-10-CM | POA: Diagnosis not present

## 2021-02-14 DIAGNOSIS — G2581 Restless legs syndrome: Secondary | ICD-10-CM | POA: Diagnosis not present

## 2021-02-14 DIAGNOSIS — I2510 Atherosclerotic heart disease of native coronary artery without angina pectoris: Secondary | ICD-10-CM | POA: Diagnosis not present

## 2021-02-14 DIAGNOSIS — E1149 Type 2 diabetes mellitus with other diabetic neurological complication: Secondary | ICD-10-CM | POA: Diagnosis not present

## 2021-02-14 DIAGNOSIS — E7800 Pure hypercholesterolemia, unspecified: Secondary | ICD-10-CM | POA: Diagnosis not present

## 2021-05-14 DIAGNOSIS — Z125 Encounter for screening for malignant neoplasm of prostate: Secondary | ICD-10-CM | POA: Diagnosis not present

## 2021-05-14 DIAGNOSIS — I2510 Atherosclerotic heart disease of native coronary artery without angina pectoris: Secondary | ICD-10-CM | POA: Diagnosis not present

## 2021-05-14 DIAGNOSIS — E7800 Pure hypercholesterolemia, unspecified: Secondary | ICD-10-CM | POA: Diagnosis not present

## 2021-05-14 DIAGNOSIS — E039 Hypothyroidism, unspecified: Secondary | ICD-10-CM | POA: Diagnosis not present

## 2021-05-14 DIAGNOSIS — E1149 Type 2 diabetes mellitus with other diabetic neurological complication: Secondary | ICD-10-CM | POA: Diagnosis not present

## 2021-05-14 DIAGNOSIS — E559 Vitamin D deficiency, unspecified: Secondary | ICD-10-CM | POA: Diagnosis not present

## 2021-05-17 DIAGNOSIS — Z139 Encounter for screening, unspecified: Secondary | ICD-10-CM | POA: Diagnosis not present

## 2021-05-17 DIAGNOSIS — E1122 Type 2 diabetes mellitus with diabetic chronic kidney disease: Secondary | ICD-10-CM | POA: Diagnosis not present

## 2021-05-17 DIAGNOSIS — N183 Chronic kidney disease, stage 3 unspecified: Secondary | ICD-10-CM | POA: Diagnosis not present

## 2021-05-17 DIAGNOSIS — E039 Hypothyroidism, unspecified: Secondary | ICD-10-CM | POA: Diagnosis not present

## 2021-05-17 DIAGNOSIS — M1711 Unilateral primary osteoarthritis, right knee: Secondary | ICD-10-CM | POA: Diagnosis not present

## 2021-05-17 DIAGNOSIS — E1149 Type 2 diabetes mellitus with other diabetic neurological complication: Secondary | ICD-10-CM | POA: Diagnosis not present

## 2021-05-17 DIAGNOSIS — I2510 Atherosclerotic heart disease of native coronary artery without angina pectoris: Secondary | ICD-10-CM | POA: Diagnosis not present

## 2021-05-17 DIAGNOSIS — Z6820 Body mass index (BMI) 20.0-20.9, adult: Secondary | ICD-10-CM | POA: Diagnosis not present

## 2021-05-17 DIAGNOSIS — E7800 Pure hypercholesterolemia, unspecified: Secondary | ICD-10-CM | POA: Diagnosis not present

## 2021-05-28 DIAGNOSIS — Z23 Encounter for immunization: Secondary | ICD-10-CM | POA: Diagnosis not present

## 2021-05-31 DIAGNOSIS — M1711 Unilateral primary osteoarthritis, right knee: Secondary | ICD-10-CM | POA: Diagnosis not present

## 2021-10-16 DIAGNOSIS — Z23 Encounter for immunization: Secondary | ICD-10-CM | POA: Diagnosis not present

## 2021-11-01 ENCOUNTER — Ambulatory Visit (INDEPENDENT_AMBULATORY_CARE_PROVIDER_SITE_OTHER): Payer: Medicare Other | Admitting: Cardiovascular Disease

## 2021-11-01 ENCOUNTER — Encounter: Payer: Self-pay | Admitting: Cardiovascular Disease

## 2021-11-01 ENCOUNTER — Other Ambulatory Visit: Payer: Self-pay

## 2021-11-01 VITALS — BP 133/70 | HR 66 | Ht 71.0 in | Wt 138.0 lb

## 2021-11-01 DIAGNOSIS — I447 Left bundle-branch block, unspecified: Secondary | ICD-10-CM | POA: Diagnosis not present

## 2021-11-01 DIAGNOSIS — I2510 Atherosclerotic heart disease of native coronary artery without angina pectoris: Secondary | ICD-10-CM | POA: Diagnosis not present

## 2021-11-01 DIAGNOSIS — E785 Hyperlipidemia, unspecified: Secondary | ICD-10-CM

## 2021-11-01 DIAGNOSIS — E782 Mixed hyperlipidemia: Secondary | ICD-10-CM | POA: Diagnosis not present

## 2021-11-01 NOTE — Patient Instructions (Signed)

## 2021-11-01 NOTE — Progress Notes (Signed)
11/01/2021 Tristan Maxwell   06/21/1934  [id number]  Primary Physician Cyndi Bender, PA-C Primary Cardiologist: Lorretta Harp MD Lupe Carney, [place]  HPI:  Tristan Maxwell is a 87 y.o.  thin and fit-appearing widowed Caucasian male, father of 85 and grandfather to 1 grandchild, whom I last saw in the office Feb 15, 2018. His wife of 60 years died in 2008/03/12 after sustaining an injury from a fall. He has 1 daughter, who lives in [place], whom he sees once a year. He saw her on the [employer] this year. I performed cardiac catheterizations on him 03-12-93 and again in October 2001 both of which showed noncritical CAD.   Since I saw him almost 3 years ago he has done well.  He is active but not as active as he was in the past.  He is completely asymptomatic.  Current Meds  Medication Sig   aspirin EC 81 MG tablet Take 1 tablet (81 mg total) by mouth daily.   B Complex Vitamins (VITAMIN B COMPLEX PO) Take 1 tablet by mouth daily.   cholecalciferol (VITAMIN D) 1000 UNITS tablet Take 1,000 Units by mouth daily.   Coenzyme Q10 200 MG capsule Take 200 mg by mouth daily.   fluticasone (FLONASE) 50 MCG/ACT nasal spray Place 1 spray into both nostrils daily as needed for allergies.    levothyroxine (SYNTHROID, LEVOTHROID) 125 MCG tablet Take 125 mcg by mouth daily before breakfast.    linagliptin (TRADJENTA) 5 MG TABS tablet Take 5 mg by mouth daily.   metFORMIN (GLUCOPHAGE) 500 MG tablet Take 500 mg by mouth daily with breakfast.    pravastatin (PRAVACHOL) 10 MG tablet Take 10 mg by mouth every other day.   Saw Palmetto, Serenoa repens, (SAW PALMETTO PO) Take 1 capsule by mouth daily.     No Known Allergies  Social History   Socioeconomic History   Marital status: Married    Spouse name: Not on file   Number of children: Not on file   Years of education: Not on file   Highest education level: Not on file  Occupational History   Not on file  Tobacco Use   Smoking status: Light  Smoker    Types: Cigars   Smokeless tobacco: Current   Tobacco comments:    cigars one or two a month  Substance and Sexual Activity   Alcohol use: Yes    Alcohol/week: 0.0 standard drinks    Comment: Occas   Drug use: No   Sexual activity: Not on file  Other Topics Concern   Not on file  Social History Narrative   Not on file   Social Determinants of Health   Financial Resource Strain: Not on file  Food Insecurity: Not on file  Transportation Needs: Not on file  Physical Activity: Not on file  Stress: Not on file  Social Connections: Not on file  Intimate Partner Violence: Not on file     Review of Systems: General: negative for chills, fever, night sweats or weight changes.  Cardiovascular: negative for chest pain, dyspnea on exertion, edema, orthopnea, palpitations, paroxysmal nocturnal dyspnea or shortness of breath Dermatological: negative for rash Respiratory: negative for cough or wheezing Urologic: negative for hematuria Abdominal: negative for nausea, vomiting, diarrhea, bright red blood per rectum, melena, or hematemesis Neurologic: negative for visual changes, syncope, or dizziness All other systems reviewed and are otherwise negative except as noted above.    Blood pressure 133/70, pulse 66, height 5'  11" (1.803 m), weight 138 lb (62.6 kg).  General appearance: alert and no distress Neck: no adenopathy, no carotid bruit, no JVD, supple, symmetrical, trachea midline, and thyroid not enlarged, symmetric, no tenderness/mass/nodules Lungs: clear to auscultation bilaterally Heart: regular rate and rhythm, S1, S2 normal, no murmur, click, rub or gallop Extremities: extremities normal, atraumatic, no cyanosis or edema Pulses: 2+ and symmetric Skin: Skin color, texture, turgor normal. No rashes or lesions Neurologic: Grossly normal  EKG sinus rhythm at 66 with left bundle branch block.  I personally reviewed this EKG.  ASSESSMENT AND PLAN:    Hyperlipidemia History of hyperlipidemia on statin therapy with lipid profile performed 04/24/2021 revealing total cholesterol 185, LDL of 87 and HDL of 88.  Left bundle branch block Chronic  CAD (coronary artery disease) History of nonobstructive CAD with cardiac catheterization which I performed in 1994 in 2002 both of which showed nonobstructive disease.  He is completely asymptomatic.     Lorretta Harp MD FACP,FACC,FAHA, [facility] 11/01/2021 10:22 AM

## 2021-11-01 NOTE — Assessment & Plan Note (Signed)
History of hyperlipidemia on statin therapy with lipid profile performed 04/24/2021 revealing total cholesterol 185, LDL of 87 and HDL of 88.

## 2021-11-01 NOTE — Assessment & Plan Note (Signed)
History of nonobstructive CAD with cardiac catheterization which I performed in 1994 in 2002 both of which showed nonobstructive disease.  He is completely asymptomatic.

## 2021-11-01 NOTE — Assessment & Plan Note (Signed)
Chronic. 

## 2021-11-18 DIAGNOSIS — E039 Hypothyroidism, unspecified: Secondary | ICD-10-CM | POA: Diagnosis not present

## 2021-11-18 DIAGNOSIS — I2510 Atherosclerotic heart disease of native coronary artery without angina pectoris: Secondary | ICD-10-CM | POA: Diagnosis not present

## 2021-11-18 DIAGNOSIS — E7800 Pure hypercholesterolemia, unspecified: Secondary | ICD-10-CM | POA: Diagnosis not present

## 2021-11-18 DIAGNOSIS — E1149 Type 2 diabetes mellitus with other diabetic neurological complication: Secondary | ICD-10-CM | POA: Diagnosis not present

## 2021-11-26 DIAGNOSIS — E7800 Pure hypercholesterolemia, unspecified: Secondary | ICD-10-CM | POA: Diagnosis not present

## 2021-11-26 DIAGNOSIS — E1122 Type 2 diabetes mellitus with diabetic chronic kidney disease: Secondary | ICD-10-CM | POA: Diagnosis not present

## 2021-11-26 DIAGNOSIS — N183 Chronic kidney disease, stage 3 unspecified: Secondary | ICD-10-CM | POA: Diagnosis not present

## 2021-11-26 DIAGNOSIS — Z681 Body mass index (BMI) 19 or less, adult: Secondary | ICD-10-CM | POA: Diagnosis not present

## 2021-11-26 DIAGNOSIS — I2510 Atherosclerotic heart disease of native coronary artery without angina pectoris: Secondary | ICD-10-CM | POA: Diagnosis not present

## 2021-11-26 DIAGNOSIS — M1711 Unilateral primary osteoarthritis, right knee: Secondary | ICD-10-CM | POA: Diagnosis not present

## 2021-11-26 DIAGNOSIS — M7061 Trochanteric bursitis, right hip: Secondary | ICD-10-CM | POA: Diagnosis not present

## 2021-11-26 DIAGNOSIS — E039 Hypothyroidism, unspecified: Secondary | ICD-10-CM | POA: Diagnosis not present

## 2021-11-26 DIAGNOSIS — E1149 Type 2 diabetes mellitus with other diabetic neurological complication: Secondary | ICD-10-CM | POA: Diagnosis not present

## 2021-11-28 DIAGNOSIS — Z9181 History of falling: Secondary | ICD-10-CM | POA: Diagnosis not present

## 2021-11-28 DIAGNOSIS — E785 Hyperlipidemia, unspecified: Secondary | ICD-10-CM | POA: Diagnosis not present

## 2021-11-28 DIAGNOSIS — Z0000 Encounter for general adult medical examination without abnormal findings: Secondary | ICD-10-CM | POA: Diagnosis not present

## 2021-11-28 DIAGNOSIS — Z1331 Encounter for screening for depression: Secondary | ICD-10-CM | POA: Diagnosis not present

## 2021-12-03 DIAGNOSIS — D3130 Benign neoplasm of unspecified choroid: Secondary | ICD-10-CM | POA: Diagnosis not present

## 2021-12-03 DIAGNOSIS — E119 Type 2 diabetes mellitus without complications: Secondary | ICD-10-CM | POA: Diagnosis not present

## 2021-12-03 DIAGNOSIS — I10 Essential (primary) hypertension: Secondary | ICD-10-CM | POA: Diagnosis not present

## 2021-12-03 DIAGNOSIS — H52223 Regular astigmatism, bilateral: Secondary | ICD-10-CM | POA: Diagnosis not present

## 2021-12-03 DIAGNOSIS — E7800 Pure hypercholesterolemia, unspecified: Secondary | ICD-10-CM | POA: Diagnosis not present

## 2021-12-03 DIAGNOSIS — H524 Presbyopia: Secondary | ICD-10-CM | POA: Diagnosis not present

## 2021-12-03 DIAGNOSIS — H35033 Hypertensive retinopathy, bilateral: Secondary | ICD-10-CM | POA: Diagnosis not present

## 2021-12-03 DIAGNOSIS — H5203 Hypermetropia, bilateral: Secondary | ICD-10-CM | POA: Diagnosis not present

## 2022-01-24 DIAGNOSIS — M1711 Unilateral primary osteoarthritis, right knee: Secondary | ICD-10-CM | POA: Diagnosis not present

## 2022-01-24 DIAGNOSIS — Z6820 Body mass index (BMI) 20.0-20.9, adult: Secondary | ICD-10-CM | POA: Diagnosis not present

## 2022-01-30 DIAGNOSIS — M4156 Other secondary scoliosis, lumbar region: Secondary | ICD-10-CM | POA: Diagnosis not present

## 2022-01-30 DIAGNOSIS — M1711 Unilateral primary osteoarthritis, right knee: Secondary | ICD-10-CM | POA: Diagnosis not present

## 2022-01-30 DIAGNOSIS — M545 Low back pain, unspecified: Secondary | ICD-10-CM | POA: Diagnosis not present

## 2022-02-05 DIAGNOSIS — L821 Other seborrheic keratosis: Secondary | ICD-10-CM | POA: Diagnosis not present

## 2022-02-05 DIAGNOSIS — C44219 Basal cell carcinoma of skin of left ear and external auricular canal: Secondary | ICD-10-CM | POA: Diagnosis not present

## 2022-02-05 DIAGNOSIS — D224 Melanocytic nevi of scalp and neck: Secondary | ICD-10-CM | POA: Diagnosis not present

## 2022-02-05 DIAGNOSIS — D225 Melanocytic nevi of trunk: Secondary | ICD-10-CM | POA: Diagnosis not present

## 2022-02-05 DIAGNOSIS — L570 Actinic keratosis: Secondary | ICD-10-CM | POA: Diagnosis not present

## 2022-02-05 DIAGNOSIS — D1801 Hemangioma of skin and subcutaneous tissue: Secondary | ICD-10-CM | POA: Diagnosis not present

## 2022-02-05 DIAGNOSIS — Z85828 Personal history of other malignant neoplasm of skin: Secondary | ICD-10-CM | POA: Diagnosis not present

## 2022-02-05 DIAGNOSIS — D485 Neoplasm of uncertain behavior of skin: Secondary | ICD-10-CM | POA: Diagnosis not present

## 2022-02-05 DIAGNOSIS — C44519 Basal cell carcinoma of skin of other part of trunk: Secondary | ICD-10-CM | POA: Diagnosis not present

## 2022-02-07 DIAGNOSIS — Z681 Body mass index (BMI) 19 or less, adult: Secondary | ICD-10-CM | POA: Diagnosis not present

## 2022-02-07 DIAGNOSIS — J019 Acute sinusitis, unspecified: Secondary | ICD-10-CM | POA: Diagnosis not present

## 2022-02-07 DIAGNOSIS — R6889 Other general symptoms and signs: Secondary | ICD-10-CM | POA: Diagnosis not present

## 2022-05-28 DIAGNOSIS — E1149 Type 2 diabetes mellitus with other diabetic neurological complication: Secondary | ICD-10-CM | POA: Diagnosis not present

## 2022-05-28 DIAGNOSIS — I2510 Atherosclerotic heart disease of native coronary artery without angina pectoris: Secondary | ICD-10-CM | POA: Diagnosis not present

## 2022-05-28 DIAGNOSIS — E559 Vitamin D deficiency, unspecified: Secondary | ICD-10-CM | POA: Diagnosis not present

## 2022-05-28 DIAGNOSIS — Z125 Encounter for screening for malignant neoplasm of prostate: Secondary | ICD-10-CM | POA: Diagnosis not present

## 2022-05-28 DIAGNOSIS — E039 Hypothyroidism, unspecified: Secondary | ICD-10-CM | POA: Diagnosis not present

## 2022-05-28 DIAGNOSIS — E7800 Pure hypercholesterolemia, unspecified: Secondary | ICD-10-CM | POA: Diagnosis not present

## 2022-05-28 DIAGNOSIS — R252 Cramp and spasm: Secondary | ICD-10-CM | POA: Diagnosis not present

## 2022-06-04 DIAGNOSIS — E1149 Type 2 diabetes mellitus with other diabetic neurological complication: Secondary | ICD-10-CM | POA: Diagnosis not present

## 2022-06-04 DIAGNOSIS — Z6821 Body mass index (BMI) 21.0-21.9, adult: Secondary | ICD-10-CM | POA: Diagnosis not present

## 2022-06-04 DIAGNOSIS — I2510 Atherosclerotic heart disease of native coronary artery without angina pectoris: Secondary | ICD-10-CM | POA: Diagnosis not present

## 2022-06-04 DIAGNOSIS — E7800 Pure hypercholesterolemia, unspecified: Secondary | ICD-10-CM | POA: Diagnosis not present

## 2022-06-04 DIAGNOSIS — E1122 Type 2 diabetes mellitus with diabetic chronic kidney disease: Secondary | ICD-10-CM | POA: Diagnosis not present

## 2022-06-04 DIAGNOSIS — M1711 Unilateral primary osteoarthritis, right knee: Secondary | ICD-10-CM | POA: Diagnosis not present

## 2022-06-04 DIAGNOSIS — N183 Chronic kidney disease, stage 3 unspecified: Secondary | ICD-10-CM | POA: Diagnosis not present

## 2022-06-04 DIAGNOSIS — Z139 Encounter for screening, unspecified: Secondary | ICD-10-CM | POA: Diagnosis not present

## 2022-06-04 DIAGNOSIS — E039 Hypothyroidism, unspecified: Secondary | ICD-10-CM | POA: Diagnosis not present

## 2022-08-06 DIAGNOSIS — L570 Actinic keratosis: Secondary | ICD-10-CM | POA: Diagnosis not present

## 2022-08-06 DIAGNOSIS — D485 Neoplasm of uncertain behavior of skin: Secondary | ICD-10-CM | POA: Diagnosis not present

## 2022-08-06 DIAGNOSIS — Z85828 Personal history of other malignant neoplasm of skin: Secondary | ICD-10-CM | POA: Diagnosis not present

## 2022-08-06 DIAGNOSIS — D044 Carcinoma in situ of skin of scalp and neck: Secondary | ICD-10-CM | POA: Diagnosis not present

## 2022-08-06 DIAGNOSIS — L820 Inflamed seborrheic keratosis: Secondary | ICD-10-CM | POA: Diagnosis not present

## 2022-09-30 DIAGNOSIS — Z23 Encounter for immunization: Secondary | ICD-10-CM | POA: Diagnosis not present

## 2022-11-19 DIAGNOSIS — J069 Acute upper respiratory infection, unspecified: Secondary | ICD-10-CM | POA: Diagnosis not present

## 2022-11-27 DIAGNOSIS — J329 Chronic sinusitis, unspecified: Secondary | ICD-10-CM | POA: Diagnosis not present

## 2022-11-27 DIAGNOSIS — R059 Cough, unspecified: Secondary | ICD-10-CM | POA: Diagnosis not present

## 2022-11-27 DIAGNOSIS — R6889 Other general symptoms and signs: Secondary | ICD-10-CM | POA: Diagnosis not present

## 2022-12-11 DIAGNOSIS — E1149 Type 2 diabetes mellitus with other diabetic neurological complication: Secondary | ICD-10-CM | POA: Diagnosis not present

## 2022-12-11 DIAGNOSIS — E7800 Pure hypercholesterolemia, unspecified: Secondary | ICD-10-CM | POA: Diagnosis not present

## 2022-12-11 DIAGNOSIS — E039 Hypothyroidism, unspecified: Secondary | ICD-10-CM | POA: Diagnosis not present

## 2022-12-11 DIAGNOSIS — I2510 Atherosclerotic heart disease of native coronary artery without angina pectoris: Secondary | ICD-10-CM | POA: Diagnosis not present

## 2022-12-17 DIAGNOSIS — E1149 Type 2 diabetes mellitus with other diabetic neurological complication: Secondary | ICD-10-CM | POA: Diagnosis not present

## 2022-12-17 DIAGNOSIS — E7800 Pure hypercholesterolemia, unspecified: Secondary | ICD-10-CM | POA: Diagnosis not present

## 2022-12-17 DIAGNOSIS — E039 Hypothyroidism, unspecified: Secondary | ICD-10-CM | POA: Diagnosis not present

## 2022-12-17 DIAGNOSIS — K6289 Other specified diseases of anus and rectum: Secondary | ICD-10-CM | POA: Diagnosis not present

## 2022-12-17 DIAGNOSIS — I2510 Atherosclerotic heart disease of native coronary artery without angina pectoris: Secondary | ICD-10-CM | POA: Diagnosis not present

## 2022-12-17 DIAGNOSIS — Z9181 History of falling: Secondary | ICD-10-CM | POA: Diagnosis not present

## 2022-12-17 DIAGNOSIS — Z1331 Encounter for screening for depression: Secondary | ICD-10-CM | POA: Diagnosis not present

## 2022-12-17 DIAGNOSIS — N183 Chronic kidney disease, stage 3 unspecified: Secondary | ICD-10-CM | POA: Diagnosis not present

## 2022-12-17 DIAGNOSIS — E1122 Type 2 diabetes mellitus with diabetic chronic kidney disease: Secondary | ICD-10-CM | POA: Diagnosis not present

## 2023-01-08 DIAGNOSIS — H524 Presbyopia: Secondary | ICD-10-CM | POA: Diagnosis not present

## 2023-01-08 DIAGNOSIS — H53143 Visual discomfort, bilateral: Secondary | ICD-10-CM | POA: Diagnosis not present

## 2023-01-08 DIAGNOSIS — H5203 Hypermetropia, bilateral: Secondary | ICD-10-CM | POA: Diagnosis not present

## 2023-01-08 DIAGNOSIS — I10 Essential (primary) hypertension: Secondary | ICD-10-CM | POA: Diagnosis not present

## 2023-01-08 DIAGNOSIS — H52223 Regular astigmatism, bilateral: Secondary | ICD-10-CM | POA: Diagnosis not present

## 2023-01-08 DIAGNOSIS — H35033 Hypertensive retinopathy, bilateral: Secondary | ICD-10-CM | POA: Diagnosis not present

## 2023-01-08 DIAGNOSIS — D3130 Benign neoplasm of unspecified choroid: Secondary | ICD-10-CM | POA: Diagnosis not present

## 2023-01-08 DIAGNOSIS — E119 Type 2 diabetes mellitus without complications: Secondary | ICD-10-CM | POA: Diagnosis not present

## 2023-01-13 DIAGNOSIS — K6289 Other specified diseases of anus and rectum: Secondary | ICD-10-CM | POA: Diagnosis not present

## 2023-01-13 DIAGNOSIS — N400 Enlarged prostate without lower urinary tract symptoms: Secondary | ICD-10-CM | POA: Diagnosis not present

## 2023-01-13 DIAGNOSIS — K644 Residual hemorrhoidal skin tags: Secondary | ICD-10-CM | POA: Diagnosis not present

## 2023-01-15 DIAGNOSIS — K5900 Constipation, unspecified: Secondary | ICD-10-CM | POA: Diagnosis not present

## 2023-01-15 DIAGNOSIS — N400 Enlarged prostate without lower urinary tract symptoms: Secondary | ICD-10-CM | POA: Diagnosis not present

## 2023-01-16 ENCOUNTER — Encounter: Payer: Self-pay | Admitting: Cardiovascular Disease

## 2023-01-16 ENCOUNTER — Ambulatory Visit: Payer: Medicare Other | Attending: Cardiovascular Disease | Admitting: Cardiovascular Disease

## 2023-01-16 VITALS — BP 118/60 | HR 63 | Ht 71.0 in | Wt 133.8 lb

## 2023-01-16 DIAGNOSIS — I447 Left bundle-branch block, unspecified: Secondary | ICD-10-CM | POA: Diagnosis not present

## 2023-01-16 DIAGNOSIS — E782 Mixed hyperlipidemia: Secondary | ICD-10-CM

## 2023-01-16 DIAGNOSIS — I2510 Atherosclerotic heart disease of native coronary artery without angina pectoris: Secondary | ICD-10-CM | POA: Diagnosis not present

## 2023-01-16 NOTE — Assessment & Plan Note (Signed)
History of nonobstructive CAD cardiac cath performed in 1994, and again in October 2001.Tristan Maxwell  He is completely asymptomatic.

## 2023-01-16 NOTE — Assessment & Plan Note (Signed)
History of hyperlipidemia on statin therapy with lipid profile performed 12/11/2022 revealing total cholesterol 162, LDL 78 and HDL 72.

## 2023-01-16 NOTE — Progress Notes (Signed)
01/16/2023 Tristan Maxwell   11-18-1934  [id number]  Primary Physician Cyndi Bender, PA-C Primary Cardiologist: Lorretta Harp MD Lupe Carney, [place]  HPI:  Tristan Maxwell is a 89 y.o.   thin and fit-appearing widowed Caucasian male, father of 64 and grandfather to 1 grandchild, whom I last saw in the office 2021/11/19. His wife of 3 years died in March 21, 2008 after sustaining an injury from a fall. He has 1 daughter, who lives in [place], whom he sees once a year. He saw her on the [employer] this year. I performed cardiac catheterizations on him 03/21/93 and again in October 2001 both of which showed noncritical CAD.    Since I saw him almost 3 years ago he has done well.  He is active but not as active as he was in the past.  He is completely asymptomatic.   Current Meds  Medication Sig   aspirin EC 81 MG tablet Take 1 tablet (81 mg total) by mouth daily.   B Complex Vitamins (VITAMIN B COMPLEX PO) Take 1 tablet by mouth daily.   cholecalciferol (VITAMIN D) 1000 UNITS tablet Take 1,000 Units by mouth daily.   Coenzyme Q10 200 MG capsule Take 200 mg by mouth daily.   fluticasone (FLONASE) 50 MCG/ACT nasal spray Place 1 spray into both nostrils daily as needed for allergies.    levothyroxine (SYNTHROID, LEVOTHROID) 125 MCG tablet Take 125 mcg by mouth daily before breakfast.    linagliptin (TRADJENTA) 5 MG TABS tablet Take 5 mg by mouth daily.   metFORMIN (GLUCOPHAGE) 500 MG tablet Take 500 mg by mouth daily with breakfast.    pravastatin (PRAVACHOL) 10 MG tablet Take 10 mg by mouth every other day.   Saw Palmetto, Serenoa repens, (SAW PALMETTO PO) Take 1 capsule by mouth daily.     No Known Allergies  Social History   Socioeconomic History   Marital status: Married    Spouse name: Not on file   Number of children: Not on file   Years of education: Not on file   Highest education level: Not on file  Occupational History   Not on file  Tobacco Use   Smoking status:  Light Smoker    Types: Cigars   Smokeless tobacco: Current   Tobacco comments:    cigars one or two a month  Substance and Sexual Activity   Alcohol use: Yes    Alcohol/week: 0.0 standard drinks of alcohol    Comment: Occas   Drug use: No   Sexual activity: Not on file  Other Topics Concern   Not on file  Social History Narrative   Not on file   Social Determinants of Health   Financial Resource Strain: Not on file  Food Insecurity: Not on file  Transportation Needs: Not on file  Physical Activity: Not on file  Stress: Not on file  Social Connections: Not on file  Intimate Partner Violence: Not on file     Review of Systems: General: negative for chills, fever, night sweats or weight changes.  Cardiovascular: negative for chest pain, dyspnea on exertion, edema, orthopnea, palpitations, paroxysmal nocturnal dyspnea or shortness of breath Dermatological: negative for rash Respiratory: negative for cough or wheezing Urologic: negative for hematuria Abdominal: negative for nausea, vomiting, diarrhea, bright red blood per rectum, melena, or hematemesis Neurologic: negative for visual changes, syncope, or dizziness All other systems reviewed and are otherwise negative except as noted above.    Blood pressure  118/60, pulse 63, height '5\' 11"'$  (1.803 m), weight 133 lb 12.8 oz (60.7 kg), SpO2 100 %.  General appearance: alert and no distress Neck: no adenopathy, no carotid bruit, no JVD, supple, symmetrical, trachea midline, and thyroid not enlarged, symmetric, no tenderness/mass/nodules Lungs: clear to auscultation bilaterally Heart: regular rate and rhythm, S1, S2 normal, no murmur, click, rub or gallop Extremities: extremities normal, atraumatic, no cyanosis or edema Pulses: 2+ and symmetric Skin: Skin color, texture, turgor normal. No rashes or lesions Neurologic: Grossly normal  EKG sinus rhythm at 63 with low bundle-branch block.  I personally reviewed this  EKG.  ASSESSMENT AND PLAN:   Hyperlipidemia History of hyperlipidemia on statin therapy with lipid profile performed 12/11/2022 revealing total cholesterol 162, LDL 78 and HDL 72.  Left bundle branch block Chronic  CAD (coronary artery disease) History of nonobstructive CAD cardiac cath performed in 1994, and again in October 2001.Marland Kitchen  He is completely asymptomatic.     Lorretta Harp MD FACP,FACC,FAHA, [facility] 01/16/2023 11:48 AM

## 2023-01-16 NOTE — Patient Instructions (Signed)
Medication Instructions:  Your physician recommends that you continue on your current medications as directed. Please refer to the Current Medication list given to you today.  *If you need a refill on your cardiac medications before your next appointment, please call your pharmacy*   Follow-Up: At [facility], you and your health needs are our priority.  As part of our continuing mission to provide you with exceptional heart care, we have created designated Provider Care Teams.  These Care Teams include your primary Cardiologist (physician) and Advanced Practice Providers (APPs -  Physician Assistants and Nurse Practitioners) who all work together to provide you with the care you need, when you need it.  We recommend signing up for the patient portal called "MyChart".  Sign up information is provided on this After Visit Summary.  MyChart is used to connect with patients for Virtual Visits (Telemedicine).  Patients are able to view lab/test results, encounter notes, upcoming appointments, etc.  Non-urgent messages can be sent to your provider as well.   To learn more about what you can do with MyChart, go to [email or web address]://[email or web address].com.    Your next appointment:   12 month(s)  Provider:   Jonathan Berry, MD    

## 2023-01-16 NOTE — Assessment & Plan Note (Signed)
Chronic. 

## 2023-02-12 DIAGNOSIS — C44319 Basal cell carcinoma of skin of other parts of face: Secondary | ICD-10-CM | POA: Diagnosis not present

## 2023-02-12 DIAGNOSIS — D225 Melanocytic nevi of trunk: Secondary | ICD-10-CM | POA: Diagnosis not present

## 2023-02-12 DIAGNOSIS — L570 Actinic keratosis: Secondary | ICD-10-CM | POA: Diagnosis not present

## 2023-02-12 DIAGNOSIS — D1801 Hemangioma of skin and subcutaneous tissue: Secondary | ICD-10-CM | POA: Diagnosis not present

## 2023-02-12 DIAGNOSIS — Z85828 Personal history of other malignant neoplasm of skin: Secondary | ICD-10-CM | POA: Diagnosis not present

## 2023-02-12 DIAGNOSIS — D485 Neoplasm of uncertain behavior of skin: Secondary | ICD-10-CM | POA: Diagnosis not present

## 2023-02-12 DIAGNOSIS — L905 Scar conditions and fibrosis of skin: Secondary | ICD-10-CM | POA: Diagnosis not present

## 2023-02-12 DIAGNOSIS — L821 Other seborrheic keratosis: Secondary | ICD-10-CM | POA: Diagnosis not present

## 2023-02-12 DIAGNOSIS — D2261 Melanocytic nevi of right upper limb, including shoulder: Secondary | ICD-10-CM | POA: Diagnosis not present

## 2023-02-12 DIAGNOSIS — D2262 Melanocytic nevi of left upper limb, including shoulder: Secondary | ICD-10-CM | POA: Diagnosis not present

## 2023-02-27 DIAGNOSIS — N400 Enlarged prostate without lower urinary tract symptoms: Secondary | ICD-10-CM | POA: Diagnosis not present

## 2023-02-27 DIAGNOSIS — K5900 Constipation, unspecified: Secondary | ICD-10-CM | POA: Diagnosis not present

## 2023-04-14 ENCOUNTER — Other Ambulatory Visit: Payer: Self-pay | Admitting: Gastroenterology

## 2023-04-14 DIAGNOSIS — R634 Abnormal weight loss: Secondary | ICD-10-CM | POA: Diagnosis not present

## 2023-04-14 DIAGNOSIS — K5900 Constipation, unspecified: Secondary | ICD-10-CM | POA: Diagnosis not present

## 2023-05-01 ENCOUNTER — Ambulatory Visit (HOSPITAL_COMMUNITY)
Admission: RE | Admit: 2023-05-01 | Discharge: 2023-05-01 | Disposition: A | Discharge status: Home / Self Care | Payer: Medicare Other | Source: Ambulatory Visit | Attending: Gastroenterology | Admitting: Gastroenterology

## 2023-05-01 DIAGNOSIS — R634 Abnormal weight loss: Secondary | ICD-10-CM | POA: Diagnosis not present

## 2023-05-01 DIAGNOSIS — K5900 Constipation, unspecified: Secondary | ICD-10-CM | POA: Diagnosis not present

## 2023-05-01 DIAGNOSIS — K760 Fatty (change of) liver, not elsewhere classified: Secondary | ICD-10-CM | POA: Diagnosis not present

## 2023-05-01 DIAGNOSIS — N289 Disorder of kidney and ureter, unspecified: Secondary | ICD-10-CM | POA: Diagnosis not present

## 2023-05-01 LAB — POCT I-STAT CREATININE: Creatinine, Ser: 1.1 mg/dL (ref 0.61–1.24)

## 2023-05-01 MED ORDER — IOHEXOL 350 MG/ML SOLN
75.0000 mL | Freq: Once | INTRAVENOUS | Status: AC | PRN
  Administered 2023-05-01: 75 mL via INTRAVENOUS

## 2023-06-18 DIAGNOSIS — Z125 Encounter for screening for malignant neoplasm of prostate: Secondary | ICD-10-CM | POA: Diagnosis not present

## 2023-06-18 DIAGNOSIS — E1149 Type 2 diabetes mellitus with other diabetic neurological complication: Secondary | ICD-10-CM | POA: Diagnosis not present

## 2023-06-18 DIAGNOSIS — I2510 Atherosclerotic heart disease of native coronary artery without angina pectoris: Secondary | ICD-10-CM | POA: Diagnosis not present

## 2023-06-18 DIAGNOSIS — E7800 Pure hypercholesterolemia, unspecified: Secondary | ICD-10-CM | POA: Diagnosis not present

## 2023-06-18 DIAGNOSIS — E039 Hypothyroidism, unspecified: Secondary | ICD-10-CM | POA: Diagnosis not present

## 2023-06-23 DIAGNOSIS — Z139 Encounter for screening, unspecified: Secondary | ICD-10-CM | POA: Diagnosis not present

## 2023-06-23 DIAGNOSIS — Z6821 Body mass index (BMI) 21.0-21.9, adult: Secondary | ICD-10-CM | POA: Diagnosis not present

## 2023-06-23 DIAGNOSIS — N400 Enlarged prostate without lower urinary tract symptoms: Secondary | ICD-10-CM | POA: Diagnosis not present

## 2023-06-23 DIAGNOSIS — E7800 Pure hypercholesterolemia, unspecified: Secondary | ICD-10-CM | POA: Diagnosis not present

## 2023-06-23 DIAGNOSIS — E039 Hypothyroidism, unspecified: Secondary | ICD-10-CM | POA: Diagnosis not present

## 2023-06-23 DIAGNOSIS — E1149 Type 2 diabetes mellitus with other diabetic neurological complication: Secondary | ICD-10-CM | POA: Diagnosis not present

## 2023-06-23 DIAGNOSIS — I2510 Atherosclerotic heart disease of native coronary artery without angina pectoris: Secondary | ICD-10-CM | POA: Diagnosis not present

## 2023-06-23 DIAGNOSIS — R972 Elevated prostate specific antigen [PSA]: Secondary | ICD-10-CM | POA: Diagnosis not present

## 2023-06-23 DIAGNOSIS — E1122 Type 2 diabetes mellitus with diabetic chronic kidney disease: Secondary | ICD-10-CM | POA: Diagnosis not present

## 2023-06-23 DIAGNOSIS — N183 Chronic kidney disease, stage 3 unspecified: Secondary | ICD-10-CM | POA: Diagnosis not present

## 2023-07-02 DIAGNOSIS — H40012 Open angle with borderline findings, low risk, left eye: Secondary | ICD-10-CM | POA: Diagnosis not present

## 2023-07-02 DIAGNOSIS — H40053 Ocular hypertension, bilateral: Secondary | ICD-10-CM | POA: Diagnosis not present

## 2023-07-02 DIAGNOSIS — H40001 Preglaucoma, unspecified, right eye: Secondary | ICD-10-CM | POA: Diagnosis not present

## 2023-08-03 ENCOUNTER — Telehealth: Payer: Self-pay

## 2023-08-03 NOTE — Patient Outreach (Signed)
  Care Coordination   08/03/2023 Name: Tristan Maxwell MRN: [id number] DOB: 08-Jun-1934   Care Coordination Outreach Attempts:  An unsuccessful telephone outreach was attempted today to offer the patient information about available care coordination services.  Follow Up Plan:  Additional outreach attempts will be made to offer the patient care coordination information and services.   Encounter Outcome:  No Answer   Care Coordination Interventions:  No, not indicated    Rowe Pavy, RN, BSN, [facility] [phone]

## 2023-08-04 ENCOUNTER — Telehealth: Payer: Self-pay

## 2023-08-04 DIAGNOSIS — E039 Hypothyroidism, unspecified: Secondary | ICD-10-CM | POA: Diagnosis not present

## 2023-08-04 DIAGNOSIS — R972 Elevated prostate specific antigen [PSA]: Secondary | ICD-10-CM | POA: Diagnosis not present

## 2023-08-04 NOTE — Patient Outreach (Signed)
  Care Coordination   Initial Visit Note   08/04/2023 Name: Tristan Maxwell MRN: [id number] DOB: March 27, 1934  Tristan Maxwell is a 89 y.o. year old male who sees Lonie Peak, [place] for primary care. I spoke with  Tristan Balsam Lye by phone today.  What matters to the patients health and wellness today?  Placed call to patient to review and offer [facility] care coordination program.  Patient reports that he is self managing well and denies any needs.    SDOH assessments and interventions completed:       Care Coordination Interventions:     Follow up plan:    Encounter Outcome:     Rowe Pavy, RN, BSN, CEN [facility] Coordinator [phone]

## 2023-10-06 DIAGNOSIS — E039 Hypothyroidism, unspecified: Secondary | ICD-10-CM | POA: Diagnosis not present

## 2023-10-06 DIAGNOSIS — R972 Elevated prostate specific antigen [PSA]: Secondary | ICD-10-CM | POA: Diagnosis not present

## 2023-11-09 DIAGNOSIS — Z23 Encounter for immunization: Secondary | ICD-10-CM | POA: Diagnosis not present

## 2023-12-28 DIAGNOSIS — R972 Elevated prostate specific antigen [PSA]: Secondary | ICD-10-CM | POA: Diagnosis not present

## 2023-12-28 DIAGNOSIS — E7800 Pure hypercholesterolemia, unspecified: Secondary | ICD-10-CM | POA: Diagnosis not present

## 2023-12-28 DIAGNOSIS — I2510 Atherosclerotic heart disease of native coronary artery without angina pectoris: Secondary | ICD-10-CM | POA: Diagnosis not present

## 2023-12-28 DIAGNOSIS — E039 Hypothyroidism, unspecified: Secondary | ICD-10-CM | POA: Diagnosis not present

## 2023-12-28 DIAGNOSIS — E1149 Type 2 diabetes mellitus with other diabetic neurological complication: Secondary | ICD-10-CM | POA: Diagnosis not present

## 2023-12-30 DIAGNOSIS — E1149 Type 2 diabetes mellitus with other diabetic neurological complication: Secondary | ICD-10-CM | POA: Diagnosis not present

## 2023-12-30 DIAGNOSIS — E039 Hypothyroidism, unspecified: Secondary | ICD-10-CM | POA: Diagnosis not present

## 2023-12-30 DIAGNOSIS — R972 Elevated prostate specific antigen [PSA]: Secondary | ICD-10-CM | POA: Diagnosis not present

## 2023-12-30 DIAGNOSIS — N400 Enlarged prostate without lower urinary tract symptoms: Secondary | ICD-10-CM | POA: Diagnosis not present

## 2023-12-30 DIAGNOSIS — N183 Chronic kidney disease, stage 3 unspecified: Secondary | ICD-10-CM | POA: Diagnosis not present

## 2023-12-30 DIAGNOSIS — Z1331 Encounter for screening for depression: Secondary | ICD-10-CM | POA: Diagnosis not present

## 2023-12-30 DIAGNOSIS — Z9181 History of falling: Secondary | ICD-10-CM | POA: Diagnosis not present

## 2023-12-30 DIAGNOSIS — E7800 Pure hypercholesterolemia, unspecified: Secondary | ICD-10-CM | POA: Diagnosis not present

## 2023-12-30 DIAGNOSIS — E1122 Type 2 diabetes mellitus with diabetic chronic kidney disease: Secondary | ICD-10-CM | POA: Diagnosis not present

## 2023-12-30 DIAGNOSIS — I2510 Atherosclerotic heart disease of native coronary artery without angina pectoris: Secondary | ICD-10-CM | POA: Diagnosis not present

## 2023-12-31 DIAGNOSIS — Z9842 Cataract extraction status, left eye: Secondary | ICD-10-CM | POA: Diagnosis not present

## 2023-12-31 DIAGNOSIS — H40012 Open angle with borderline findings, low risk, left eye: Secondary | ICD-10-CM | POA: Diagnosis not present

## 2023-12-31 DIAGNOSIS — Z961 Presence of intraocular lens: Secondary | ICD-10-CM | POA: Diagnosis not present

## 2023-12-31 DIAGNOSIS — H524 Presbyopia: Secondary | ICD-10-CM | POA: Diagnosis not present

## 2023-12-31 DIAGNOSIS — H52223 Regular astigmatism, bilateral: Secondary | ICD-10-CM | POA: Diagnosis not present

## 2023-12-31 DIAGNOSIS — H26491 Other secondary cataract, right eye: Secondary | ICD-10-CM | POA: Diagnosis not present

## 2023-12-31 DIAGNOSIS — D3130 Benign neoplasm of unspecified choroid: Secondary | ICD-10-CM | POA: Diagnosis not present

## 2023-12-31 DIAGNOSIS — H5203 Hypermetropia, bilateral: Secondary | ICD-10-CM | POA: Diagnosis not present

## 2023-12-31 DIAGNOSIS — H40052 Ocular hypertension, left eye: Secondary | ICD-10-CM | POA: Diagnosis not present

## 2023-12-31 DIAGNOSIS — H53143 Visual discomfort, bilateral: Secondary | ICD-10-CM | POA: Diagnosis not present

## 2023-12-31 DIAGNOSIS — E119 Type 2 diabetes mellitus without complications: Secondary | ICD-10-CM | POA: Diagnosis not present

## 2024-01-22 ENCOUNTER — Ambulatory Visit: Payer: Medicare Other | Attending: Cardiovascular Disease | Admitting: Cardiovascular Disease

## 2024-01-22 ENCOUNTER — Encounter: Payer: Self-pay | Admitting: Cardiovascular Disease

## 2024-01-22 VITALS — BP 108/40 | HR 58 | Ht 71.0 in | Wt 141.0 lb

## 2024-01-22 DIAGNOSIS — I2510 Atherosclerotic heart disease of native coronary artery without angina pectoris: Secondary | ICD-10-CM | POA: Insufficient documentation

## 2024-01-22 DIAGNOSIS — E782 Mixed hyperlipidemia: Secondary | ICD-10-CM | POA: Diagnosis not present

## 2024-01-22 NOTE — Progress Notes (Signed)
01/22/2024 Kazuma Elena Southern   June 25, 1934  [id number]  Primary Physician Lonie Peak, PA-C Primary Cardiologist: Runell Gess MD Nicholes Calamity, [place]  HPI:  Tristan Maxwell is a 89 y.o.   thin and fit-appearing widowed Caucasian male, father of 1 and grandfather to 1 grandchild, whom I last saw in the office 02-Feb-2023. His wife of 50 years died in Mar 27, 2009after sustaining an injury from a fall. He has 1 daughter, who lives in [place], whom he sees once a year. He saw her on the [employer] this year. I performed cardiac catheterizations on him 1994 and again in October 2001 both of which showed noncritical CAD.    Since I saw him almost 1 year ago he has done well.  He is active but not as active as he was in the past.  He does do yard work.  He is completely asymptomatic.   Current Meds  Medication Sig   aspirin EC 81 MG tablet Take 1 tablet (81 mg total) by mouth daily.   B Complex Vitamins (VITAMIN B COMPLEX PO) Take 1 tablet by mouth daily.   cholecalciferol (VITAMIN D) 1000 UNITS tablet Take 1,000 Units by mouth daily.   Coenzyme Q10 200 MG capsule Take 200 mg by mouth daily.   fluticasone (FLONASE) 50 MCG/ACT nasal spray Place 1 spray into both nostrils daily as needed for allergies.    levothyroxine (SYNTHROID, LEVOTHROID) 125 MCG tablet Take 125 mcg by mouth daily before breakfast.    linagliptin (TRADJENTA) 5 MG TABS tablet Take 5 mg by mouth daily.   metFORMIN (GLUCOPHAGE) 500 MG tablet Take 500 mg by mouth daily with breakfast.    pravastatin (PRAVACHOL) 10 MG tablet Take 10 mg by mouth every other day.   Saw Palmetto, Serenoa repens, (SAW PALMETTO PO) Take 1 capsule by mouth daily.     No Known Allergies  Social History   Socioeconomic History   Marital status: Married    Spouse name: Not on file   Number of children: Not on file   Years of education: Not on file   Highest education level: Not on file  Occupational History   Not on file  Tobacco  Use   Smoking status: Light Smoker    Types: Cigars   Smokeless tobacco: Current   Tobacco comments:    cigars one or two a month  Substance and Sexual Activity   Alcohol use: Yes    Alcohol/week: 0.0 standard drinks of alcohol    Comment: Occas   Drug use: No   Sexual activity: Not on file  Other Topics Concern   Not on file  Social History Narrative   Not on file   Social Drivers of Health   Financial Resource Strain: Not on file  Food Insecurity: Not on file  Transportation Needs: Not on file  Physical Activity: Not on file  Stress: Not on file  Social Connections: Not on file  Intimate Partner Violence: Not on file     Review of Systems: General: negative for chills, fever, night sweats or weight changes.  Cardiovascular: negative for chest pain, dyspnea on exertion, edema, orthopnea, palpitations, paroxysmal nocturnal dyspnea or shortness of breath Dermatological: negative for rash Respiratory: negative for cough or wheezing Urologic: negative for hematuria Abdominal: negative for nausea, vomiting, diarrhea, bright red blood per rectum, melena, or hematemesis Neurologic: negative for visual changes, syncope, or dizziness All other systems reviewed and are otherwise negative except as noted  above.    Blood pressure (!) 108/40, pulse (!) 58, height 5\' 11"  (1.803 m), weight 141 lb (64 kg), SpO2 98%.  General appearance: alert and no distress Neck: no adenopathy, no carotid bruit, no JVD, supple, symmetrical, trachea midline, and thyroid not enlarged, symmetric, no tenderness/mass/nodules Lungs: clear to auscultation bilaterally Heart: regular rate and rhythm, S1, S2 normal, no murmur, click, rub or gallop Extremities: extremities normal, atraumatic, no cyanosis or edema Pulses: 2+ and symmetric Skin: Skin color, texture, turgor normal. No rashes or lesions Neurologic: Grossly normal  EKG EKG Interpretation Date/Time:  Friday January 22 2024 08:14:44  EST Ventricular Rate:  58 PR Interval:  208 QRS Duration:  150 QT Interval:  456 QTC Calculation: 447 R Axis:   15  Text Interpretation: Sinus rhythm with 2nd degree A-V block (Mobitz II) Left bundle branch block When compared with ECG of 19-Jul-2015 05:55, Sinus rhythm is now with 2nd degree A-V block (Mobitz II) T wave inversion no longer evident in Anterior leads Confirmed by Nanetta Batty [id number]) on 01/22/2024 8:32:14 AM    ASSESSMENT AND PLAN:   Hyperlipidemia History of hyperlipidemia on pravastatin with lipid profile performed 12/28/2023 revealing total cholesterol 171, LDL 73 and HDL of 88.  CAD (coronary artery disease) History of noncritical CAD by my cath performed in 1994 and again in 2001.  He is asymptomatic.     Runell Gess MD FACP,FACC,FAHA, [facility] 01/22/2024 8:40 AM

## 2024-01-22 NOTE — Patient Instructions (Signed)
    Follow-Up: At [facility], you and your health needs are our priority.  As part of our continuing mission to provide you with exceptional heart care, we have created designated Provider Care Teams.  These Care Teams include your primary Cardiologist (physician) and Advanced Practice Providers (APPs -  Physician Assistants and Nurse Practitioners) who all work together to provide you with the care you need, when you need it.  We recommend signing up for the patient portal called "MyChart".  Sign up information is provided on this After Visit Summary.  MyChart is used to connect with patients for Virtual Visits (Telemedicine).  Patients are able to view lab/test results, encounter notes, upcoming appointments, etc.  Non-urgent messages can be sent to your provider as well.   To learn more about what you can do with MyChart, go to [email or web address].    Your next appointment:   12 month(s)  Provider:   Jonathan Berry MD   

## 2024-01-22 NOTE — Assessment & Plan Note (Signed)
History of hyperlipidemia on pravastatin with lipid profile performed 12/28/2023 revealing total cholesterol 171, LDL 73 and HDL of 88.

## 2024-01-22 NOTE — Assessment & Plan Note (Signed)
History of noncritical CAD by my cath performed in 1994 and again in 2001.  He is asymptomatic.

## 2024-02-09 DIAGNOSIS — N410 Acute prostatitis: Secondary | ICD-10-CM | POA: Diagnosis not present

## 2024-02-15 DIAGNOSIS — C44619 Basal cell carcinoma of skin of left upper limb, including shoulder: Secondary | ICD-10-CM | POA: Diagnosis not present

## 2024-02-15 DIAGNOSIS — L570 Actinic keratosis: Secondary | ICD-10-CM | POA: Diagnosis not present

## 2024-02-15 DIAGNOSIS — D0462 Carcinoma in situ of skin of left upper limb, including shoulder: Secondary | ICD-10-CM | POA: Diagnosis not present

## 2024-02-15 DIAGNOSIS — D224 Melanocytic nevi of scalp and neck: Secondary | ICD-10-CM | POA: Diagnosis not present

## 2024-02-15 DIAGNOSIS — D2262 Melanocytic nevi of left upper limb, including shoulder: Secondary | ICD-10-CM | POA: Diagnosis not present

## 2024-02-15 DIAGNOSIS — Z85828 Personal history of other malignant neoplasm of skin: Secondary | ICD-10-CM | POA: Diagnosis not present

## 2024-02-15 DIAGNOSIS — L821 Other seborrheic keratosis: Secondary | ICD-10-CM | POA: Diagnosis not present

## 2024-02-15 DIAGNOSIS — D485 Neoplasm of uncertain behavior of skin: Secondary | ICD-10-CM | POA: Diagnosis not present

## 2024-02-15 DIAGNOSIS — D225 Melanocytic nevi of trunk: Secondary | ICD-10-CM | POA: Diagnosis not present

## 2024-02-15 DIAGNOSIS — D2261 Melanocytic nevi of right upper limb, including shoulder: Secondary | ICD-10-CM | POA: Diagnosis not present

## 2024-03-08 DIAGNOSIS — E1149 Type 2 diabetes mellitus with other diabetic neurological complication: Secondary | ICD-10-CM | POA: Diagnosis not present

## 2024-03-08 DIAGNOSIS — N400 Enlarged prostate without lower urinary tract symptoms: Secondary | ICD-10-CM | POA: Diagnosis not present

## 2024-03-30 DIAGNOSIS — R972 Elevated prostate specific antigen [PSA]: Secondary | ICD-10-CM | POA: Diagnosis not present

## 2024-03-30 DIAGNOSIS — E7800 Pure hypercholesterolemia, unspecified: Secondary | ICD-10-CM | POA: Diagnosis not present

## 2024-03-30 DIAGNOSIS — I2510 Atherosclerotic heart disease of native coronary artery without angina pectoris: Secondary | ICD-10-CM | POA: Diagnosis not present

## 2024-03-30 DIAGNOSIS — E1149 Type 2 diabetes mellitus with other diabetic neurological complication: Secondary | ICD-10-CM | POA: Diagnosis not present

## 2024-03-30 DIAGNOSIS — E039 Hypothyroidism, unspecified: Secondary | ICD-10-CM | POA: Diagnosis not present

## 2024-04-01 DIAGNOSIS — S0181XA Laceration without foreign body of other part of head, initial encounter: Secondary | ICD-10-CM | POA: Diagnosis not present

## 2024-04-11 DIAGNOSIS — E039 Hypothyroidism, unspecified: Secondary | ICD-10-CM | POA: Diagnosis not present

## 2024-04-11 DIAGNOSIS — R2689 Other abnormalities of gait and mobility: Secondary | ICD-10-CM | POA: Diagnosis not present

## 2024-04-11 DIAGNOSIS — Z23 Encounter for immunization: Secondary | ICD-10-CM | POA: Diagnosis not present

## 2024-04-11 DIAGNOSIS — E1122 Type 2 diabetes mellitus with diabetic chronic kidney disease: Secondary | ICD-10-CM | POA: Diagnosis not present

## 2024-04-11 DIAGNOSIS — R972 Elevated prostate specific antigen [PSA]: Secondary | ICD-10-CM | POA: Diagnosis not present

## 2024-04-11 DIAGNOSIS — N400 Enlarged prostate without lower urinary tract symptoms: Secondary | ICD-10-CM | POA: Diagnosis not present

## 2024-04-11 DIAGNOSIS — E7800 Pure hypercholesterolemia, unspecified: Secondary | ICD-10-CM | POA: Diagnosis not present

## 2024-04-11 DIAGNOSIS — I2510 Atherosclerotic heart disease of native coronary artery without angina pectoris: Secondary | ICD-10-CM | POA: Diagnosis not present

## 2024-04-11 DIAGNOSIS — N183 Chronic kidney disease, stage 3 unspecified: Secondary | ICD-10-CM | POA: Diagnosis not present

## 2024-04-11 DIAGNOSIS — E1149 Type 2 diabetes mellitus with other diabetic neurological complication: Secondary | ICD-10-CM | POA: Diagnosis not present

## 2024-04-26 DIAGNOSIS — M6281 Muscle weakness (generalized): Secondary | ICD-10-CM | POA: Diagnosis not present

## 2024-04-26 DIAGNOSIS — R2689 Other abnormalities of gait and mobility: Secondary | ICD-10-CM | POA: Diagnosis not present

## 2024-04-26 DIAGNOSIS — M25552 Pain in left hip: Secondary | ICD-10-CM | POA: Diagnosis not present

## 2024-05-03 DIAGNOSIS — R2689 Other abnormalities of gait and mobility: Secondary | ICD-10-CM | POA: Diagnosis not present

## 2024-05-03 DIAGNOSIS — M25552 Pain in left hip: Secondary | ICD-10-CM | POA: Diagnosis not present

## 2024-05-03 DIAGNOSIS — M6281 Muscle weakness (generalized): Secondary | ICD-10-CM | POA: Diagnosis not present

## 2024-05-06 DIAGNOSIS — R2689 Other abnormalities of gait and mobility: Secondary | ICD-10-CM | POA: Diagnosis not present

## 2024-05-06 DIAGNOSIS — M25552 Pain in left hip: Secondary | ICD-10-CM | POA: Diagnosis not present

## 2024-05-06 DIAGNOSIS — M6281 Muscle weakness (generalized): Secondary | ICD-10-CM | POA: Diagnosis not present

## 2024-05-10 DIAGNOSIS — R2689 Other abnormalities of gait and mobility: Secondary | ICD-10-CM | POA: Diagnosis not present

## 2024-05-10 DIAGNOSIS — M6281 Muscle weakness (generalized): Secondary | ICD-10-CM | POA: Diagnosis not present

## 2024-05-10 DIAGNOSIS — M25552 Pain in left hip: Secondary | ICD-10-CM | POA: Diagnosis not present

## 2024-05-12 DIAGNOSIS — R2689 Other abnormalities of gait and mobility: Secondary | ICD-10-CM | POA: Diagnosis not present

## 2024-05-12 DIAGNOSIS — M25552 Pain in left hip: Secondary | ICD-10-CM | POA: Diagnosis not present

## 2024-05-12 DIAGNOSIS — M6281 Muscle weakness (generalized): Secondary | ICD-10-CM | POA: Diagnosis not present

## 2024-05-17 DIAGNOSIS — M6281 Muscle weakness (generalized): Secondary | ICD-10-CM | POA: Diagnosis not present

## 2024-05-17 DIAGNOSIS — R2689 Other abnormalities of gait and mobility: Secondary | ICD-10-CM | POA: Diagnosis not present

## 2024-05-17 DIAGNOSIS — M25552 Pain in left hip: Secondary | ICD-10-CM | POA: Diagnosis not present

## 2024-05-19 DIAGNOSIS — M25552 Pain in left hip: Secondary | ICD-10-CM | POA: Diagnosis not present

## 2024-05-19 DIAGNOSIS — R2689 Other abnormalities of gait and mobility: Secondary | ICD-10-CM | POA: Diagnosis not present

## 2024-05-19 DIAGNOSIS — M6281 Muscle weakness (generalized): Secondary | ICD-10-CM | POA: Diagnosis not present

## 2024-05-24 DIAGNOSIS — M6281 Muscle weakness (generalized): Secondary | ICD-10-CM | POA: Diagnosis not present

## 2024-05-24 DIAGNOSIS — R2689 Other abnormalities of gait and mobility: Secondary | ICD-10-CM | POA: Diagnosis not present

## 2024-05-24 DIAGNOSIS — M25552 Pain in left hip: Secondary | ICD-10-CM | POA: Diagnosis not present

## 2024-05-26 DIAGNOSIS — R2689 Other abnormalities of gait and mobility: Secondary | ICD-10-CM | POA: Diagnosis not present

## 2024-05-26 DIAGNOSIS — M6281 Muscle weakness (generalized): Secondary | ICD-10-CM | POA: Diagnosis not present

## 2024-05-26 DIAGNOSIS — M25552 Pain in left hip: Secondary | ICD-10-CM | POA: Diagnosis not present

## 2024-05-31 DIAGNOSIS — R2689 Other abnormalities of gait and mobility: Secondary | ICD-10-CM | POA: Diagnosis not present

## 2024-05-31 DIAGNOSIS — M25552 Pain in left hip: Secondary | ICD-10-CM | POA: Diagnosis not present

## 2024-05-31 DIAGNOSIS — M6281 Muscle weakness (generalized): Secondary | ICD-10-CM | POA: Diagnosis not present

## 2024-06-02 DIAGNOSIS — M6281 Muscle weakness (generalized): Secondary | ICD-10-CM | POA: Diagnosis not present

## 2024-06-02 DIAGNOSIS — M25552 Pain in left hip: Secondary | ICD-10-CM | POA: Diagnosis not present

## 2024-06-02 DIAGNOSIS — R2689 Other abnormalities of gait and mobility: Secondary | ICD-10-CM | POA: Diagnosis not present

## 2024-06-07 DIAGNOSIS — M6281 Muscle weakness (generalized): Secondary | ICD-10-CM | POA: Diagnosis not present

## 2024-06-07 DIAGNOSIS — R2689 Other abnormalities of gait and mobility: Secondary | ICD-10-CM | POA: Diagnosis not present

## 2024-06-07 DIAGNOSIS — M25552 Pain in left hip: Secondary | ICD-10-CM | POA: Diagnosis not present

## 2024-06-09 DIAGNOSIS — M6281 Muscle weakness (generalized): Secondary | ICD-10-CM | POA: Diagnosis not present

## 2024-06-09 DIAGNOSIS — R2689 Other abnormalities of gait and mobility: Secondary | ICD-10-CM | POA: Diagnosis not present

## 2024-06-09 DIAGNOSIS — M25552 Pain in left hip: Secondary | ICD-10-CM | POA: Diagnosis not present

## 2024-06-14 DIAGNOSIS — M25552 Pain in left hip: Secondary | ICD-10-CM | POA: Diagnosis not present

## 2024-06-14 DIAGNOSIS — M6281 Muscle weakness (generalized): Secondary | ICD-10-CM | POA: Diagnosis not present

## 2024-06-14 DIAGNOSIS — R2689 Other abnormalities of gait and mobility: Secondary | ICD-10-CM | POA: Diagnosis not present

## 2024-06-16 DIAGNOSIS — M6281 Muscle weakness (generalized): Secondary | ICD-10-CM | POA: Diagnosis not present

## 2024-06-16 DIAGNOSIS — R2689 Other abnormalities of gait and mobility: Secondary | ICD-10-CM | POA: Diagnosis not present

## 2024-06-16 DIAGNOSIS — M25552 Pain in left hip: Secondary | ICD-10-CM | POA: Diagnosis not present

## 2024-06-21 DIAGNOSIS — R2689 Other abnormalities of gait and mobility: Secondary | ICD-10-CM | POA: Diagnosis not present

## 2024-06-21 DIAGNOSIS — M6281 Muscle weakness (generalized): Secondary | ICD-10-CM | POA: Diagnosis not present

## 2024-06-21 DIAGNOSIS — M25552 Pain in left hip: Secondary | ICD-10-CM | POA: Diagnosis not present

## 2024-06-23 DIAGNOSIS — M6281 Muscle weakness (generalized): Secondary | ICD-10-CM | POA: Diagnosis not present

## 2024-06-23 DIAGNOSIS — M25552 Pain in left hip: Secondary | ICD-10-CM | POA: Diagnosis not present

## 2024-06-23 DIAGNOSIS — R2689 Other abnormalities of gait and mobility: Secondary | ICD-10-CM | POA: Diagnosis not present

## 2024-06-28 DIAGNOSIS — M6281 Muscle weakness (generalized): Secondary | ICD-10-CM | POA: Diagnosis not present

## 2024-06-28 DIAGNOSIS — M25552 Pain in left hip: Secondary | ICD-10-CM | POA: Diagnosis not present

## 2024-06-28 DIAGNOSIS — R2689 Other abnormalities of gait and mobility: Secondary | ICD-10-CM | POA: Diagnosis not present

## 2024-06-30 DIAGNOSIS — M6281 Muscle weakness (generalized): Secondary | ICD-10-CM | POA: Diagnosis not present

## 2024-06-30 DIAGNOSIS — R2689 Other abnormalities of gait and mobility: Secondary | ICD-10-CM | POA: Diagnosis not present

## 2024-06-30 DIAGNOSIS — M25552 Pain in left hip: Secondary | ICD-10-CM | POA: Diagnosis not present

## 2024-07-05 DIAGNOSIS — M6281 Muscle weakness (generalized): Secondary | ICD-10-CM | POA: Diagnosis not present

## 2024-07-05 DIAGNOSIS — R2689 Other abnormalities of gait and mobility: Secondary | ICD-10-CM | POA: Diagnosis not present

## 2024-07-05 DIAGNOSIS — M25552 Pain in left hip: Secondary | ICD-10-CM | POA: Diagnosis not present

## 2024-07-11 DIAGNOSIS — E7800 Pure hypercholesterolemia, unspecified: Secondary | ICD-10-CM | POA: Diagnosis not present

## 2024-07-11 DIAGNOSIS — I2510 Atherosclerotic heart disease of native coronary artery without angina pectoris: Secondary | ICD-10-CM | POA: Diagnosis not present

## 2024-07-11 DIAGNOSIS — E1149 Type 2 diabetes mellitus with other diabetic neurological complication: Secondary | ICD-10-CM | POA: Diagnosis not present

## 2024-07-11 DIAGNOSIS — E039 Hypothyroidism, unspecified: Secondary | ICD-10-CM | POA: Diagnosis not present

## 2024-07-14 DIAGNOSIS — N183 Chronic kidney disease, stage 3 unspecified: Secondary | ICD-10-CM | POA: Diagnosis not present

## 2024-07-14 DIAGNOSIS — G5601 Carpal tunnel syndrome, right upper limb: Secondary | ICD-10-CM | POA: Diagnosis not present

## 2024-07-14 DIAGNOSIS — E1149 Type 2 diabetes mellitus with other diabetic neurological complication: Secondary | ICD-10-CM | POA: Diagnosis not present

## 2024-07-14 DIAGNOSIS — R2689 Other abnormalities of gait and mobility: Secondary | ICD-10-CM | POA: Diagnosis not present

## 2024-07-14 DIAGNOSIS — E039 Hypothyroidism, unspecified: Secondary | ICD-10-CM | POA: Diagnosis not present

## 2024-07-14 DIAGNOSIS — N400 Enlarged prostate without lower urinary tract symptoms: Secondary | ICD-10-CM | POA: Diagnosis not present

## 2024-07-14 DIAGNOSIS — E7800 Pure hypercholesterolemia, unspecified: Secondary | ICD-10-CM | POA: Diagnosis not present

## 2024-07-14 DIAGNOSIS — I2510 Atherosclerotic heart disease of native coronary artery without angina pectoris: Secondary | ICD-10-CM | POA: Diagnosis not present

## 2024-07-14 DIAGNOSIS — Z139 Encounter for screening, unspecified: Secondary | ICD-10-CM | POA: Diagnosis not present

## 2024-07-14 DIAGNOSIS — E1122 Type 2 diabetes mellitus with diabetic chronic kidney disease: Secondary | ICD-10-CM | POA: Diagnosis not present

## 2024-10-26 DIAGNOSIS — Z23 Encounter for immunization: Secondary | ICD-10-CM | POA: Diagnosis not present

## 2024-11-16 DIAGNOSIS — D044 Carcinoma in situ of skin of scalp and neck: Secondary | ICD-10-CM | POA: Diagnosis not present

## 2024-11-16 DIAGNOSIS — C4442 Squamous cell carcinoma of skin of scalp and neck: Secondary | ICD-10-CM | POA: Diagnosis not present

## 2024-11-16 DIAGNOSIS — Z85828 Personal history of other malignant neoplasm of skin: Secondary | ICD-10-CM | POA: Diagnosis not present

## 2024-11-16 DIAGNOSIS — D485 Neoplasm of uncertain behavior of skin: Secondary | ICD-10-CM | POA: Diagnosis not present

## 2024-11-16 DIAGNOSIS — D0421 Carcinoma in situ of skin of right ear and external auricular canal: Secondary | ICD-10-CM | POA: Diagnosis not present

## 2025-02-03 ENCOUNTER — Ambulatory Visit: Admitting: Nurse Practitioner
